# Patient Record
Sex: Female | Born: 1980 | Race: White | Hispanic: No | Marital: Married | Smoking: Former smoker
Health system: Southern US, Community
[De-identification: ages and names within clinical notes are randomized; demographics above are authoritative.]

## PROBLEM LIST (undated history)

## (undated) DIAGNOSIS — O139 Gestational [pregnancy-induced] hypertension without significant proteinuria, unspecified trimester: Secondary | ICD-10-CM

## (undated) DIAGNOSIS — E039 Hypothyroidism, unspecified: Secondary | ICD-10-CM

## (undated) HISTORY — PX: OTHER SURGICAL HISTORY: SHX169

## (undated) HISTORY — DX: Hypothyroidism, unspecified: E03.9

## (undated) HISTORY — DX: Gestational (pregnancy-induced) hypertension without significant proteinuria, unspecified trimester: O13.9

## (undated) HISTORY — PX: WISDOM TOOTH EXTRACTION: SHX21

## (undated) HISTORY — PX: GANGLION CYST EXCISION: SHX1691

## (undated) HISTORY — PX: NO PAST SURGERIES: SHX2092

---

## 2019-03-31 DIAGNOSIS — O09521 Supervision of elderly multigravida, first trimester: Secondary | ICD-10-CM | POA: Insufficient documentation

## 2019-03-31 LAB — OB RESULTS CONSOLE HEPATITIS B SURFACE ANTIGEN
Hepatitis B Surface Ag: NEGATIVE
Hepatitis B Surface Ag: POSITIVE

## 2019-03-31 LAB — OB RESULTS CONSOLE ABO/RH: RH Type: NEGATIVE

## 2019-03-31 LAB — OB RESULTS CONSOLE PLATELET COUNT: Platelets: 295

## 2019-03-31 LAB — OB RESULTS CONSOLE GC/CHLAMYDIA
Chlamydia: NEGATIVE
Gonorrhea: NEGATIVE

## 2019-03-31 LAB — OB RESULTS CONSOLE HIV ANTIBODY (ROUTINE TESTING)
HIV: NONREACTIVE
HIV: NONREACTIVE

## 2019-03-31 LAB — OB RESULTS CONSOLE RUBELLA ANTIBODY, IGM: Rubella: IMMUNE

## 2019-03-31 LAB — OB RESULTS CONSOLE HGB/HCT, BLOOD
HCT: 35 (ref 29–41)
Hemoglobin: 11.8

## 2019-03-31 LAB — OB RESULTS CONSOLE ANTIBODY SCREEN: Antibody Screen: NEGATIVE

## 2019-03-31 LAB — OB RESULTS CONSOLE RPR: RPR: NONREACTIVE

## 2019-04-28 LAB — OB RESULTS CONSOLE TSH: TSH: 1.063

## 2019-06-09 ENCOUNTER — Encounter: Payer: Self-pay | Admitting: General Practice

## 2019-06-26 ENCOUNTER — Ambulatory Visit (INDEPENDENT_AMBULATORY_CARE_PROVIDER_SITE_OTHER): Payer: Medicaid Other | Admitting: Obstetrics and Gynecology

## 2019-06-26 ENCOUNTER — Other Ambulatory Visit: Payer: Self-pay

## 2019-06-26 ENCOUNTER — Encounter: Payer: Self-pay | Admitting: Obstetrics and Gynecology

## 2019-06-26 ENCOUNTER — Encounter: Payer: Self-pay | Admitting: General Practice

## 2019-06-26 VITALS — BP 114/76 | HR 96 | Temp 97.8°F | Ht 64.0 in | Wt 192.0 lb

## 2019-06-26 DIAGNOSIS — Z3A22 22 weeks gestation of pregnancy: Secondary | ICD-10-CM

## 2019-06-26 DIAGNOSIS — O99280 Endocrine, nutritional and metabolic diseases complicating pregnancy, unspecified trimester: Secondary | ICD-10-CM

## 2019-06-26 DIAGNOSIS — O09529 Supervision of elderly multigravida, unspecified trimester: Secondary | ICD-10-CM | POA: Insufficient documentation

## 2019-06-26 DIAGNOSIS — O0990 Supervision of high risk pregnancy, unspecified, unspecified trimester: Secondary | ICD-10-CM

## 2019-06-26 DIAGNOSIS — E039 Hypothyroidism, unspecified: Secondary | ICD-10-CM

## 2019-06-26 DIAGNOSIS — M5432 Sciatica, left side: Secondary | ICD-10-CM

## 2019-06-26 DIAGNOSIS — O285 Abnormal chromosomal and genetic finding on antenatal screening of mother: Secondary | ICD-10-CM | POA: Insufficient documentation

## 2019-06-26 MED ORDER — COMFORT FIT MATERNITY SUPP SM MISC: 1.0000 [IU] | Freq: Every day | 0 refills | Status: DC | PRN

## 2019-06-26 MED ORDER — CYCLOBENZAPRINE HCL 10 MG PO TABS: 10.0000 mg | ORAL_TABLET | Freq: Every day | ORAL | 0 refills | Status: DC

## 2019-06-26 NOTE — Patient Instructions (Addendum)
Pregnancy and Hypothyroidism Hypothyroidism is a condition that develops if your thyroid has low activity. The thyroid is a small, butterfly-shaped gland in your neck. It is located in front of your windpipe. It makes hormones that play an important role in regulating your breathing, heart rate, menstrual cycle, body temperature, and other bodily functions. If you have hypothyroidism, your thyroid gland does not produce enough thyroid hormones. When you are pregnant, your body uses more thyroid hormones. This can cause mild hypothyroidism to get worse. How does this affect me? Hypothyroidism during pregnancy can cause you to have:  Fatigue.  Abnormal weight gain. For women of normal weight, it is common to gain about 1 pound per week during pregnancy.  Difficulty having a bowel movement (constipation).  Feeling cold more often than others do.  Muscle aches.  Pregnancy complications, such as: ? High blood pressure that develops after the 20th week of pregnancy (preeclampsia). ? Pregnancy loss (miscarriage). ? Preterm birth. ? Placenta problems. How does this affect my baby? Hypothyroidism can also affect your baby. Babies need thyroid hormone from their mothers for normal growth and brain development. Babies born to mothers with hypothyroidism during pregnancy may:  Be born prematurely.  Have low birth weight.  Have mental delays.  May develop hypothyroidism. This is rare. What can I do to lower my risk? Some women with hypothyroidism need extra iodine during pregnancy. Your health care provider may recommend that you:  Eat foods with iodine, such as: ? Iodized salt. ? Pasteurized eggs and dairy products. ? Low-mercury seafood.  Take a prenatal vitamin that contains iodine.  Take iodine supplements. How is this treated? Treatment may include:  Monitoring. If you have mild hypothyroidism, your health care provider will monitor your thyroid hormone levels closely to watch  for any changes.  Medicines. Your health care provider may prescribe medicine to control your thyroid hormone levels. Follow these instructions at home:  Take over-the-counter and prescription medicines only as told by your health care provider. ? Check with your health care provider before taking any hypothyroid medicines that were prescribed before you became pregnant. Many are safe, but some treatments for hypothyroidism may have to be stopped during pregnancy.  You may be asked to perform kick counts to monitor your baby's movements. If your baby moves fewer than 10 times in 2 hours during a period when the baby is usually active (typically in the evening), you should see your health care provider right away.  Take a prenatal vitamin as told by your health care provider.  Keep all follow-up visits. This is important. Contact a health care provider if you:  Have new symptoms or your symptoms get worse.  Gain more than 5 lb (2.3 kg) in 1 week.  Have a lump in your neck.  Have a scratchy throat or difficulty speaking that lasts longer than a month and is not related to a cold.  Have a hard time swallowing. Get help right away if:  Your baby is less active than normal.  Your baby stops moving completely.  You develop muscle cramps.  You have pain in your abdomen.  You have heavy bleeding.  You develop a fever or chills.  You have a very bad headache or vision problems.  You develop swelling in your legs and ankles. Summary  Hypothyroidism is a condition that develops if your thyroid has low activity.  Hypothyroidism during pregnancy can lead to complications for both you and your baby.  Take medicines, vitamins, and  supplements as told by your health care provider during your pregnancy to control your condition.  Keep regular prenatal appointments so your health care provider can closely monitor your condition during pregnancy. This information is not intended to  replace advice given to you by your health care provider. Make sure you discuss any questions you have with your health care provider. Document Revised: 08/09/2018 Document Reviewed: 05/22/2017 Elsevier Patient Education  2020 [employer] Inc[employer]   PREGNANCY SUPPORT BELT: You are not alone, Seventy-five percent of women have some sort of abdominal or back pain at some point in their pregnancy. Your baby is growing at a fast pace, which means that your whole body is rapidly trying to adjust to the changes. As your uterus grows, your back may start feeling a bit under stress and this can result in back or abdominal pain that can go from mild, and therefore bearable, to severe pains that will not allow you to sit or lay down comfortably, When it comes to dealing with pregnancy-related pains and cramps, some pregnant women usually prefer natural remedies, which the market is filled with nowadays. For example, wearing a pregnancy support belt can help ease and lessen your discomfort and pain. WHAT ARE THE BENEFITS OF WEARING A PREGNANCY SUPPORT BELT? A pregnancy support belt provides support to the lower portion of the belly taking some of the weight of the growing uterus and distributing to the other parts of your body. It is designed make you comfortable and gives you extra support. Over the years, the pregnancy apparel market has been studying the needs and wants of pregnant women and they have come up with the most comfortable pregnancy support belts that woman could ever ask for. In fact, you will no longer have to wear a stretched-out or bulky pregnancy belt that is visible underneath your clothes and makes you feel even more uncomfortable. Nowadays, a pregnancy support belt is made of comfortable and stretchy materials that will not irritate your skin but will actually make you feel at ease and you will not even notice you are wearing it. They are easy to put on and adjust during the day and can be worn at night  for additional support.  BENEFITS: . Relives Back pain . Relieves Abdominal Muscle and Leg Pain . Stabilizes the Pelvic Ring . Offers a Cushioned Abdominal Lift Pad . Relieves pressure on the Sciatic Nerve Within Minutes WHERE TO GET YOUR PREGNANCY BELT:  [employer]  [address] [place], [place] [address]  [phone]  [email or web address]  Sciatica  Sciatica is pain, numbness, weakness, or tingling along the path of the sciatic nerve. The sciatic nerve starts in the lower back and runs down the back of each leg. The nerve controls the muscles in the lower leg and in the back of the knee. It also provides feeling (sensation) to the back of the thigh, the lower leg, and the sole of the foot. Sciatica is a symptom of another medical condition that pinches or puts pressure on the sciatic nerve. Sciatica most often only affects one side of the body. Sciatica usually goes away on its own or with treatment. In some cases, sciatica may come back (recur). What are the causes? This condition is caused by pressure on the sciatic nerve or pinching of the nerve. This may be the result of:  A disk in between the bones of the spine bulging out too far (herniated disk).  Age-related changes in the spinal disks.  A pain disorder that affects a muscle in the buttock.  Extra bone growth near the sciatic nerve.  A break (fracture) of the pelvis.  Pregnancy.  Tumor. This is rare. What increases the risk? The following factors may make you more likely to develop this condition:  Playing sports that place pressure or stress on the spine.  Having poor strength and flexibility.  A history of back injury or surgery.  Sitting for long periods of time.  Doing activities that involve repetitive bending or lifting.  Obesity. What are the signs or symptoms? Symptoms can vary from mild to very severe, and they may include:  Any of these problems in the lower back, leg, hip,  or buttock: ? Mild tingling, numbness, or dull aches. ? Burning sensations. ? Sharp pains.  Numbness in the back of the calf or the sole of the foot.  Leg weakness.  Severe back pain that makes movement difficult. Symptoms may get worse when you cough, sneeze, or laugh, or when you sit or stand for long periods of time. How is this diagnosed? This condition may be diagnosed based on:  Your symptoms and medical history.  A physical exam.  Blood tests.  Imaging tests, such as: ? X-rays. ? MRI. ? CT scan. How is this treated? In many cases, this condition improves on its own without treatment. However, treatment may include:  Reducing or modifying physical activity.  Exercising and stretching.  Icing and applying heat to the affected area.  Medicines that help to: ? Relieve pain and swelling. ? Relax your muscles.  Injections of medicines that help to relieve pain, irritation, and inflammation around the sciatic nerve (steroids).  Surgery. Follow these instructions at home: Medicines  Take over-the-counter and prescription medicines only as told by your health care provider.  Ask your health care provider if the medicine prescribed to you: ? Requires you to avoid driving or using heavy machinery. ? Can cause constipation. You may need to take these actions to prevent or treat constipation:  Drink enough fluid to keep your urine pale yellow.  Take over-the-counter or prescription medicines.  Eat foods that are high in fiber, such as beans, whole grains, and fresh fruits and vegetables.  Limit foods that are high in fat and processed sugars, such as fried or sweet foods. Managing pain      If directed, put ice on the affected area. ? Put ice in a plastic bag. ? Place a towel between your skin and the bag. ? Leave the ice on for 20 minutes, 2-3 times a day.  If directed, apply heat to the affected area. Use the heat source that your health care provider  recommends, such as a moist heat pack or a heating pad. ? Place a towel between your skin and the heat source. ? Leave the heat on for 20-30 minutes. ? Remove the heat if your skin turns bright red. This is especially important if you are unable to feel pain, heat, or cold. You may have a greater risk of getting burned. Activity   Return to your normal activities as told by your health care provider. Ask your health care provider what activities are safe for you.  Avoid activities that make your symptoms worse.  Take brief periods of rest throughout the day. ? When you rest for longer periods, mix in some mild activity or stretching between periods of rest. This will help to prevent stiffness and pain. ? Avoid sitting for long periods of  time without moving. Get up and move around at least one time each hour.  Exercise and stretch regularly, as told by your health care provider.  Do not lift anything that is heavier than 10 lb (4.5 kg) while you have symptoms of sciatica. When you do not have symptoms, you should still avoid heavy lifting, especially repetitive heavy lifting.  When you lift objects, always use proper lifting technique, which includes: ? Bending your knees. ? Keeping the load close to your body. ? Avoiding twisting. General instructions  Maintain a healthy weight. Excess weight puts extra stress on your back.  Wear supportive, comfortable shoes. Avoid wearing high heels.  Avoid sleeping on a mattress that is too soft or too hard. A mattress that is firm enough to support your back when you sleep may help to reduce your pain.  Keep all follow-up visits as told by your health care provider. This is important. Contact a health care provider if:  You have pain that: ? Wakes you up when you are sleeping. ? Gets worse when you lie down. ? Is worse than you have experienced in the past. ? Lasts longer than 4 weeks.  You have an unexplained weight loss. Get help right  away if:  You are not able to control when you urinate or have bowel movements (incontinence).  You have: ? Weakness in your lower back, pelvis, buttocks, or legs that gets worse. ? Redness or swelling of your back. ? A burning sensation when you urinate. Summary  Sciatica is pain, numbness, weakness, or tingling along the path of the sciatic nerve.  This condition is caused by pressure on the sciatic nerve or pinching of the nerve.  Sciatica can cause pain, numbness, or tingling in the lower back, legs, hips, and buttocks.  Treatment often includes rest, exercise, medicines, and applying ice or heat. This information is not intended to replace advice given to you by your health care provider. Make sure you discuss any questions you have with your health care provider. Document Revised: 05/06/2018 Document Reviewed: 05/06/2018 Elsevier Patient Education  2020 [employer].

## 2019-06-26 NOTE — Progress Notes (Signed)
INITIAL OBSTETRICAL VISIT Patient name: Krista Carr MRN [id number]  Date of birth: Nov 03, 1980 Chief Complaint:   Initial Prenatal Visit  History of Present Illness:   Krista Carr is a 39 y.o. G1P0 Caucasian female at [redacted]w[redacted]d by IUI date 02/03/2019 with an Estimated Date of Delivery: 10/26/19 being seen today for her initial obstetrical visit. She is transferring her care from [facility] in [place], [place] to [facility]. Her obstetrical history is significant for female infertility, failed IVF x 2, blighted ovum with last embryo via IVF (06/2018), IUI with donor sperm [facility] 02/03/2019), hypothyroidism with "out of range thyroid labs" on Levothyroxine 75 mcg, sciatic pain on Flexeril, abnormal NIPS -- fetal echos x 2 (05/26/2019 & 06/20/2019); both normal per patient. 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 This is a planned pregnancy. She and spouse live together. She has a support system that consists of her spouse, family and friends. Today she reports sciatic nerve pain and needing refill on Flexeril .   Patient's last menstrual period was 01/19/2019. Last pap "2 years ago". Results were: normal Review of Systems:   Pertinent items are noted in HPI Denies cramping/contractions, leakage of fluid, vaginal bleeding, abnormal vaginal discharge w/ itching/odor/irritation, headaches, visual changes, shortness of breath, chest pain, abdominal pain, severe nausea/vomiting, or problems with urination or bowel movements unless otherwise stated above.  Pertinent History Reviewed:  Reviewed past medical,surgical, social, obstetrical and family history.  Reviewed problem list, medications and allergies. OB History  Gravida Para Term Preterm AB Living  1            SAB TAB Ectopic Multiple Live Births               # Outcome Date GA Lbr Len/2nd Weight Sex Delivery Anes PTL Lv   1 Current            Physical Assessment:   Vitals:   06/26/19 1042 06/26/19 1046  BP: 114/76   Pulse: 96   Temp: 97.8 F (36.6 C)   Weight: 192 lb (87.1 kg)   Height:  5\' 4"  (1.626 m)  Body mass index is 32.96 kg/m.       Physical Examination:  General appearance - well appearing, and in no distress  Mental status - alert, oriented to person, place, and time  Psych:  She has a normal mood and affect  Skin - warm and dry, normal color, no suspicious lesions noted  Chest - effort normal, all lung fields clear to auscultation bilaterally  Heart - normal rate and regular rhythm  Abdomen - soft, nontender  Extremities:  No swelling or varicosities noted  Pelvic - VULVA: normal appearing vulva with no masses, tenderness or lesions  VAGINA: normal appearing vagina with normal color and discharge, no lesions.   CERVIX: normal appearing cervix without discharge or lesions, no CMT  Thin prep pap is not done HR HPV cotesting   No results found for this or any previous visit (from the past 24 hour(s)).  Assessment & Plan:  1) High-Risk Pregnancy G1P0 at [redacted]w[redacted]d with an Estimated Date of Delivery: 10/26/19   2) Initial OB visit - Welcomed to practice and introduced self to patient in addition to discussing other advanced practice providers that she may be seeing at this practice - Congratulated patient - Anticipatory guidance on upcoming appointments - Educated on COVID19 and pregnancy and the  integration of virtual appointments  - Educated on babyscripts app- patient reports she has not received email, encouraged to look in spam folder and to call office if she still has not received email - patient verbalizes understanding   3) Hypothyroid in pregnancy, antepartum - Thyroid Panel With TSH per pt request - Advised patient she would be better managed by MDs in our practice d/t need to adjust thyroid medications. Schedule with [facility] or [facility]  4) Sciatic nerve pain, left  - Rx for  cyclobenzaprine (FLEXERIL) 10 MG tablet,  - Rx for Elastic Bandages & Supports (COMFORT FIT MATERNITY SUPP SM) MISC,   Meds:  Meds ordered this encounter  Medications  . cyclobenzaprine (FLEXERIL) 10 MG tablet    Sig: Take 1 tablet (10 mg total) by mouth at bedtime.    Dispense:  30 tablet    Refill:  0    Order Specific Question:   Supervising Provider    Answer:   Donnamae Jude [[id number]]  . DISCONTD: Elastic Bandages & Supports (COMFORT FIT MATERNITY SUPP SM) MISC    Sig: 1 Units by Does not apply route daily as needed.    Dispense:  1 each    Refill:  0    Order Specific Question:   Supervising Provider    Answer:   Donnamae Jude [[id number]]  . Elastic Bandages & Supports (COMFORT FIT MATERNITY SUPP SM) MISC    Sig: 1 Units by Does not apply route daily as needed.    Dispense:  1 each    Refill:  0    Order Specific Question:   Supervising Provider    Answer:   Merrily Pew    Initial labs & prenatal records reviewed Continue prenatal vitamins Reviewed n/v relief measures and warning s/s to report Reviewed recommended weight gain based on pre-gravid BMI Encouraged well-balanced diet Genetic Screening discussed: results reviewed Cystic fibrosis, SMA, Fragile X screening discussed results reviewed The nature of [place] with multiple MDs and other Advanced Practice Providers was explained to patient; also emphasized that residents, students are part of our team.  Discussed optimized OB schedule and video visits. Advised can have an in-office visit whenever she feels she needs to be seen.  Does not have own BP cuff. Explained to patient that BP will be mailed to her house. Check BP weekly, let us know if >140/90. Advised to call during normal business hours and there is an after-hours nurse line available.   Follow-up: Return in about 6 weeks (around 08/07/2019) for Return OB with MD -  2hr GTT.   Orders Placed This Encounter    Procedures  . OB RESULTS CONSOLE GC/Chlamydia  . OB RESULTS CONSOLE RPR  . OB RESULTS CONSOLE HIV antibody  . OB RESULTS CONSOLE Rubella Antibody  . OB RESULTS CONSOLE Hepatitis B surface antigen  . OB RESULTS CONSOLE Hemoglobin and hematocrit, blood  . OB RESULTS CONSOLE PLATELET COUNT  . Thyroid Panel With TSH  . OB RESULTS CONSOLE TSH  . OB RESULTS CONSOLE ABO/Rh  . OB RESULTS CONSOLE Antibody Screen    Laury Deep MSN, CNM 06/26/2019

## 2019-06-27 LAB — THYROID PANEL WITH TSH
Free Thyroxine Index: 1.5 (ref 1.2–4.9)
T3 Uptake Ratio: 12 % — ABNORMAL LOW (ref 24–39)
T4, Total: 12.5 ug/dL — ABNORMAL HIGH (ref 4.5–12.0)
TSH: 1.34 u[IU]/mL (ref 0.450–4.500)

## 2019-07-01 ENCOUNTER — Other Ambulatory Visit: Payer: Self-pay | Admitting: *Deleted

## 2019-07-01 MED ORDER — LEVOTHYROXINE SODIUM 75 MCG PO TABS: 75.0000 ug | ORAL_TABLET | Freq: Every day | ORAL | 3 refills | Status: DC

## 2019-07-01 NOTE — Telephone Encounter (Signed)
Patient called requesting a refill for Thyroid medication. Her previous provider will not refill the medication since she is pregnant. Patient is out of medication as of today. Took last dose this AM. Please advise.   Clovis Pu, RN

## 2019-08-11 ENCOUNTER — Other Ambulatory Visit: Payer: Medicaid Other

## 2019-08-11 ENCOUNTER — Other Ambulatory Visit: Payer: Self-pay

## 2019-08-11 ENCOUNTER — Encounter: Payer: Self-pay | Admitting: Obstetrics and Gynecology

## 2019-08-11 ENCOUNTER — Ambulatory Visit (INDEPENDENT_AMBULATORY_CARE_PROVIDER_SITE_OTHER): Payer: Medicaid Other | Admitting: Obstetrics and Gynecology

## 2019-08-11 VITALS — BP 127/82 | HR 81 | Wt 201.0 lb

## 2019-08-11 DIAGNOSIS — Z3A29 29 weeks gestation of pregnancy: Secondary | ICD-10-CM

## 2019-08-11 DIAGNOSIS — Z6791 Unspecified blood type, Rh negative: Secondary | ICD-10-CM

## 2019-08-11 DIAGNOSIS — O99280 Endocrine, nutritional and metabolic diseases complicating pregnancy, unspecified trimester: Secondary | ICD-10-CM | POA: Insufficient documentation

## 2019-08-11 DIAGNOSIS — O285 Abnormal chromosomal and genetic finding on antenatal screening of mother: Secondary | ICD-10-CM

## 2019-08-11 DIAGNOSIS — O09523 Supervision of elderly multigravida, third trimester: Secondary | ICD-10-CM

## 2019-08-11 DIAGNOSIS — O99283 Endocrine, nutritional and metabolic diseases complicating pregnancy, third trimester: Secondary | ICD-10-CM

## 2019-08-11 DIAGNOSIS — O26893 Other specified pregnancy related conditions, third trimester: Secondary | ICD-10-CM | POA: Diagnosis not present

## 2019-08-11 DIAGNOSIS — O09529 Supervision of elderly multigravida, unspecified trimester: Secondary | ICD-10-CM

## 2019-08-11 DIAGNOSIS — O26899 Other specified pregnancy related conditions, unspecified trimester: Secondary | ICD-10-CM

## 2019-08-11 DIAGNOSIS — E039 Hypothyroidism, unspecified: Secondary | ICD-10-CM

## 2019-08-11 MED ORDER — RHO D IMMUNE GLOBULIN 1500 UNIT/2ML IJ SOSY
300.0000 ug | PREFILLED_SYRINGE | Freq: Once | INTRAMUSCULAR | Status: AC
  Administered 2019-08-11: 300 ug via INTRAMUSCULAR

## 2019-08-11 MED ORDER — COMFORT FIT MATERNITY SUPP LG MISC: 0 refills | Status: DC

## 2019-08-11 NOTE — Progress Notes (Signed)
ROB   C/O: Back pain and restless legs.   Rhogam given today w/o any problems.

## 2019-08-11 NOTE — Progress Notes (Addendum)
   PRENATAL VISIT NOTE  Subjective:  Krista Carr is a 39 y.o. G1P0 at [redacted]w[redacted]d being seen today for ongoing prenatal care.  She is currently monitored for the following issues for this high-risk pregnancy and has High-risk pregnancy, multigravida of advanced maternal age, antepartum; Abnormal chromosomal and genetic finding on antenatal screening mother; and Hypothyroidism affecting pregnancy, antepartum on their problem list.  Patient reports no complaints.  Contractions: Not present. Vag. Bleeding: None.  Movement: Present. Denies leaking of fluid.   The following portions of the patient's history were reviewed and updated as appropriate: allergies, current medications, past family history, past medical history, past social history, past surgical history and problem list.   Objective:   Vitals:   08/11/19 0821  BP: 127/82  Pulse: 81  Weight: 201 lb (91.2 kg)    Fetal Status: Fetal Heart Rate (bpm): 138 Fundal Height: 30 cm Movement: Present     General:  Alert, oriented and cooperative. Patient is in no acute distress.  Skin: Skin is warm and dry. No rash noted.   Cardiovascular: Normal heart rate noted  Respiratory: Normal respiratory effort, no problems with respiration noted  Abdomen: Soft, gravid, appropriate for gestational age.  Pain/Pressure: Absent     Pelvic: Cervical exam deferred        Extremities: Normal range of motion.  Edema: Trace  Mental Status: Normal mood and affect. Normal behavior. Normal judgment and thought content.   Assessment and Plan:  Pregnancy: G1P0 at [redacted]w[redacted]d 1. High-risk pregnancy, multigravida of advanced maternal age, antepartum Patient is doing well Third trimester labs today Normal TSH last visit Patient is researching pediatricians Patient scheduled for grwoth ultrasound with [facility] this week - CBC - Glucose Tolerance, 2 Hours w/1 Hour - RPR - HIV Antibody (routine testing w rflx)  2. Hypothyroidism affecting pregnancy,  antepartum Continue on current synthroid dose  3. Abnormal chromosomal and genetic finding on antenatal screening mother Seen by genetic counseling regarding maternal mosaicism and elevated risk for Down Syndrome on first trimester screen  4. Rh negative, antepartum S/p rhogam today - rho (d) immune globulin (RHIG/RHOPHYLAC) injection 300 mcg  Preterm labor symptoms and general obstetric precautions including but not limited to vaginal bleeding, contractions, leaking of fluid and fetal movement were reviewed in detail with the patient. Please refer to After Visit Summary for other counseling recommendations.   Return in about 2 weeks (around 08/25/2019) for in person, ROB, High risk.  No future appointments.  Catalina Antigua, MD

## 2019-08-12 LAB — GLUCOSE TOLERANCE, 2 HOURS W/ 1HR
Glucose, 1 hour: 140 mg/dL (ref 65–179)
Glucose, 2 hour: 119 mg/dL (ref 65–152)
Glucose, Fasting: 75 mg/dL (ref 65–91)

## 2019-08-12 LAB — CBC
Hematocrit: 33.4 % — ABNORMAL LOW (ref 34.0–46.6)
Hemoglobin: 11.4 g/dL (ref 11.1–15.9)
MCH: 31 pg (ref 26.6–33.0)
MCHC: 34.1 g/dL (ref 31.5–35.7)
MCV: 91 fL (ref 79–97)
Platelets: 254 10*3/uL (ref 150–450)
RBC: 3.68 x10E6/uL — ABNORMAL LOW (ref 3.77–5.28)
RDW: 12.2 % (ref 11.7–15.4)
WBC: 8.7 10*3/uL (ref 3.4–10.8)

## 2019-08-12 LAB — HIV ANTIBODY (ROUTINE TESTING W REFLEX): HIV Screen 4th Generation wRfx: NONREACTIVE

## 2019-08-12 LAB — RPR: RPR Ser Ql: NONREACTIVE

## 2019-08-25 ENCOUNTER — Other Ambulatory Visit: Payer: Self-pay

## 2019-08-25 ENCOUNTER — Encounter: Payer: Self-pay | Admitting: Obstetrics and Gynecology

## 2019-08-25 ENCOUNTER — Ambulatory Visit (INDEPENDENT_AMBULATORY_CARE_PROVIDER_SITE_OTHER): Payer: Medicaid Other | Admitting: Obstetrics and Gynecology

## 2019-08-25 VITALS — BP 134/89 | HR 91 | Wt 199.0 lb

## 2019-08-25 DIAGNOSIS — O09523 Supervision of elderly multigravida, third trimester: Secondary | ICD-10-CM

## 2019-08-25 DIAGNOSIS — E039 Hypothyroidism, unspecified: Secondary | ICD-10-CM

## 2019-08-25 DIAGNOSIS — O99283 Endocrine, nutritional and metabolic diseases complicating pregnancy, third trimester: Secondary | ICD-10-CM

## 2019-08-25 DIAGNOSIS — O99280 Endocrine, nutritional and metabolic diseases complicating pregnancy, unspecified trimester: Secondary | ICD-10-CM

## 2019-08-25 DIAGNOSIS — O285 Abnormal chromosomal and genetic finding on antenatal screening of mother: Secondary | ICD-10-CM

## 2019-08-25 DIAGNOSIS — Z3A31 31 weeks gestation of pregnancy: Secondary | ICD-10-CM

## 2019-08-25 DIAGNOSIS — Z6791 Unspecified blood type, Rh negative: Secondary | ICD-10-CM | POA: Insufficient documentation

## 2019-08-25 DIAGNOSIS — O09529 Supervision of elderly multigravida, unspecified trimester: Secondary | ICD-10-CM

## 2019-08-25 DIAGNOSIS — O26893 Other specified pregnancy related conditions, third trimester: Secondary | ICD-10-CM

## 2019-08-25 MED ORDER — BUTALBITAL-APAP-CAFFEINE 50-325-40 MG PO CAPS: 1.0000 | ORAL_CAPSULE | Freq: Four times a day (QID) | ORAL | 3 refills | Status: DC | PRN

## 2019-08-25 NOTE — Progress Notes (Signed)
   PRENATAL VISIT NOTE  Subjective:  Krista Carr is a 39 y.o. G1P0 at [redacted]w[redacted]d being seen today for ongoing prenatal care.  She is currently monitored for the following issues for this high-risk pregnancy and has High-risk pregnancy, multigravida of advanced maternal age, antepartum; Abnormal chromosomal and genetic finding on antenatal screening mother; Hypothyroidism affecting pregnancy, antepartum; and Rh negative state in antepartum period on their problem list.  Patient reports headache.  Contractions: Not present. Vag. Bleeding: None.  Movement: Present. Denies leaking of fluid.   The following portions of the patient's history were reviewed and updated as appropriate: allergies, current medications, past family history, past medical history, past social history, past surgical history and problem list.   Objective:   Vitals:   08/25/19 0954  BP: 134/89  Pulse: 91  Weight: 199 lb (90.3 kg)    Fetal Status: Fetal Heart Rate (bpm): 159 Fundal Height: 32 cm Movement: Present     General:  Alert, oriented and cooperative. Patient is in no acute distress.  Skin: Skin is warm and dry. No rash noted.   Cardiovascular: Normal heart rate noted  Respiratory: Normal respiratory effort, no problems with respiration noted  Abdomen: Soft, gravid, appropriate for gestational age.  Pain/Pressure: Absent     Pelvic: Cervical exam deferred        Extremities: Normal range of motion.  Edema: Trace  Mental Status: Normal mood and affect. Normal behavior. Normal judgment and thought content.   Assessment and Plan:  Pregnancy: G1P0 at [redacted]w[redacted]d 1. High-risk pregnancy, multigravida of advanced maternal age, antepartum Patient is doing well. Rx Fioricet provided for migraine headaches Normal growth ultrasound 08/18/19 EFW 1715 gm (69% tile)  2. Hypothyroidism affecting pregnancy, antepartum Continue Synthroid  3. Abnormal chromosomal and genetic finding on antenatal screening mother S/p genetic  counseling  Preterm labor symptoms and general obstetric precautions including but not limited to vaginal bleeding, contractions, leaking of fluid and fetal movement were reviewed in detail with the patient. Please refer to After Visit Summary for other counseling recommendations.   Return in about 2 weeks (around 09/08/2019) for ROB, High risk.  No future appointments.  Catalina Antigua, MD

## 2019-08-25 NOTE — Progress Notes (Signed)
ROB.  C/o headaches 8/10 x 3 days, she has tried everything and nothing works.

## 2019-09-08 ENCOUNTER — Other Ambulatory Visit: Payer: Self-pay

## 2019-09-08 ENCOUNTER — Encounter: Payer: Self-pay | Admitting: Obstetrics and Gynecology

## 2019-09-08 ENCOUNTER — Ambulatory Visit (INDEPENDENT_AMBULATORY_CARE_PROVIDER_SITE_OTHER): Payer: Medicaid Other | Admitting: Obstetrics and Gynecology

## 2019-09-08 VITALS — BP 116/78 | HR 89 | Wt 203.0 lb

## 2019-09-08 DIAGNOSIS — O99280 Endocrine, nutritional and metabolic diseases complicating pregnancy, unspecified trimester: Secondary | ICD-10-CM

## 2019-09-08 DIAGNOSIS — O26899 Other specified pregnancy related conditions, unspecified trimester: Secondary | ICD-10-CM

## 2019-09-08 DIAGNOSIS — O09529 Supervision of elderly multigravida, unspecified trimester: Secondary | ICD-10-CM

## 2019-09-08 DIAGNOSIS — O285 Abnormal chromosomal and genetic finding on antenatal screening of mother: Secondary | ICD-10-CM

## 2019-09-08 DIAGNOSIS — Z6791 Unspecified blood type, Rh negative: Secondary | ICD-10-CM

## 2019-09-08 DIAGNOSIS — E039 Hypothyroidism, unspecified: Secondary | ICD-10-CM

## 2019-09-08 NOTE — Progress Notes (Signed)
Pt would like to know about how often she should have TSH labs.

## 2019-09-08 NOTE — Progress Notes (Signed)
Subjective:  Krista Carr is a 39 y.o. G1P0 at [redacted]w[redacted]d being seen today for ongoing prenatal care.  She is currently monitored for the following issues for this high-risk pregnancy and has High-risk pregnancy, multigravida of advanced maternal age, antepartum; Abnormal chromosomal and genetic finding on antenatal screening mother; Hypothyroidism affecting pregnancy, antepartum; and Rh negative state in antepartum period on their problem list.  Patient reports general discmforts of pregnancy.  Contractions: Not present. Vag. Bleeding: None.  Movement: Present. Denies leaking of fluid.   The following portions of the patient's history were reviewed and updated as appropriate: allergies, current medications, past family history, past medical history, past social history, past surgical history and problem list. Problem list updated.  Objective:   Vitals:   09/08/19 0927  BP: 116/78  Pulse: 89  Weight: 203 lb (92.1 kg)    Fetal Status: Fetal Heart Rate (bpm): 130   Movement: Present     General:  Alert, oriented and cooperative. Patient is in no acute distress.  Skin: Skin is warm and dry. No rash noted.   Cardiovascular: Normal heart rate noted  Respiratory: Normal respiratory effort, no problems with respiration noted  Abdomen: Soft, gravid, appropriate for gestational age. Pain/Pressure: Absent     Pelvic:  Cervical exam deferred        Extremities: Normal range of motion.  Edema: Trace  Mental Status: Normal mood and affect. Normal behavior. Normal judgment and thought content.   Urinalysis:      Assessment and Plan:  Pregnancy: G1P0 at [redacted]w[redacted]d  1. High-risk pregnancy, multigravida of advanced maternal age, antepartum Stable - [place] MFM OB DETAIL +14 WK; Future  2. Hypothyroidism affecting pregnancy, antepartum Continue with Syntyhroid - Thyroid Panel With TSH - [place] MFM OB DETAIL +14 WK; Future  3. Abnormal chromosomal and genetic finding on antenatal screening mother S/P genetic  counseling - [place] MFM OB DETAIL +14 WK; Future  4. Rh negative state in antepartum period S/P Rhogam  Preterm labor symptoms and general obstetric precautions including but not limited to vaginal bleeding, contractions, leaking of fluid and fetal movement were reviewed in detail with the patient. Please refer to After Visit Summary for other counseling recommendations.  Return in about 2 weeks (around 09/22/2019) for OB visit, face to face, female MD if able.   Hermina Staggers, MD

## 2019-09-09 LAB — THYROID PANEL WITH TSH
Free Thyroxine Index: 1.9 (ref 1.2–4.9)
T3 Uptake Ratio: 16 % — ABNORMAL LOW (ref 24–39)
T4, Total: 12 ug/dL (ref 4.5–12.0)
TSH: 1.2 u[IU]/mL (ref 0.450–4.500)

## 2019-09-22 ENCOUNTER — Other Ambulatory Visit: Payer: Self-pay

## 2019-09-22 ENCOUNTER — Ambulatory Visit: Payer: Medicaid Other | Admitting: *Deleted

## 2019-09-22 ENCOUNTER — Ambulatory Visit (INDEPENDENT_AMBULATORY_CARE_PROVIDER_SITE_OTHER): Payer: Medicaid Other | Admitting: Obstetrics & Gynecology

## 2019-09-22 ENCOUNTER — Encounter: Payer: Self-pay | Admitting: Obstetrics & Gynecology

## 2019-09-22 ENCOUNTER — Ambulatory Visit: Payer: Medicaid Other | Attending: Obstetrics and Gynecology

## 2019-09-22 VITALS — BP 135/85 | HR 101 | Wt 202.6 lb

## 2019-09-22 DIAGNOSIS — Z3A35 35 weeks gestation of pregnancy: Secondary | ICD-10-CM

## 2019-09-22 DIAGNOSIS — Z6791 Unspecified blood type, Rh negative: Secondary | ICD-10-CM

## 2019-09-22 DIAGNOSIS — O09523 Supervision of elderly multigravida, third trimester: Secondary | ICD-10-CM

## 2019-09-22 DIAGNOSIS — O09813 Supervision of pregnancy resulting from assisted reproductive technology, third trimester: Secondary | ICD-10-CM | POA: Diagnosis not present

## 2019-09-22 DIAGNOSIS — O285 Abnormal chromosomal and genetic finding on antenatal screening of mother: Secondary | ICD-10-CM

## 2019-09-22 DIAGNOSIS — O09529 Supervision of elderly multigravida, unspecified trimester: Secondary | ICD-10-CM | POA: Insufficient documentation

## 2019-09-22 DIAGNOSIS — O26899 Other specified pregnancy related conditions, unspecified trimester: Secondary | ICD-10-CM | POA: Insufficient documentation

## 2019-09-22 DIAGNOSIS — E039 Hypothyroidism, unspecified: Secondary | ICD-10-CM | POA: Diagnosis present

## 2019-09-22 DIAGNOSIS — Z363 Encounter for antenatal screening for malformations: Secondary | ICD-10-CM

## 2019-09-22 DIAGNOSIS — O09513 Supervision of elderly primigravida, third trimester: Secondary | ICD-10-CM

## 2019-09-22 DIAGNOSIS — O99280 Endocrine, nutritional and metabolic diseases complicating pregnancy, unspecified trimester: Secondary | ICD-10-CM | POA: Insufficient documentation

## 2019-09-22 DIAGNOSIS — O281 Abnormal biochemical finding on antenatal screening of mother: Secondary | ICD-10-CM | POA: Diagnosis not present

## 2019-09-22 DIAGNOSIS — O99283 Endocrine, nutritional and metabolic diseases complicating pregnancy, third trimester: Secondary | ICD-10-CM

## 2019-09-22 DIAGNOSIS — O2693 Pregnancy related conditions, unspecified, third trimester: Secondary | ICD-10-CM

## 2019-09-22 IMAGING — US US MFM OB DETAIL+14 WK
1 series · 12 of 28 positions shown · non-contrast
Comparison: none

[Series 1: us mfm ob detail+14 wk · 12 of 112 slices shown]
[im 5/112]
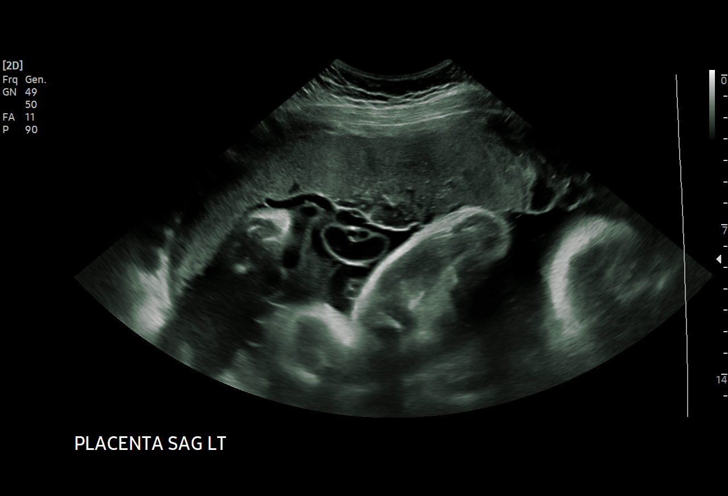
[im 13/112]
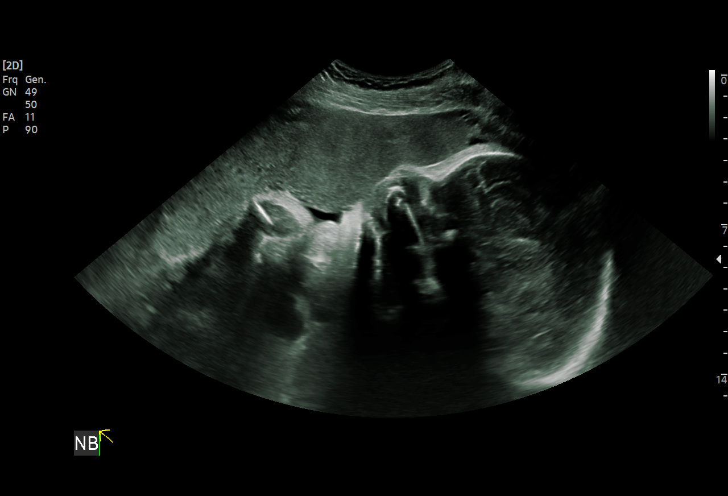
[im 21/112]
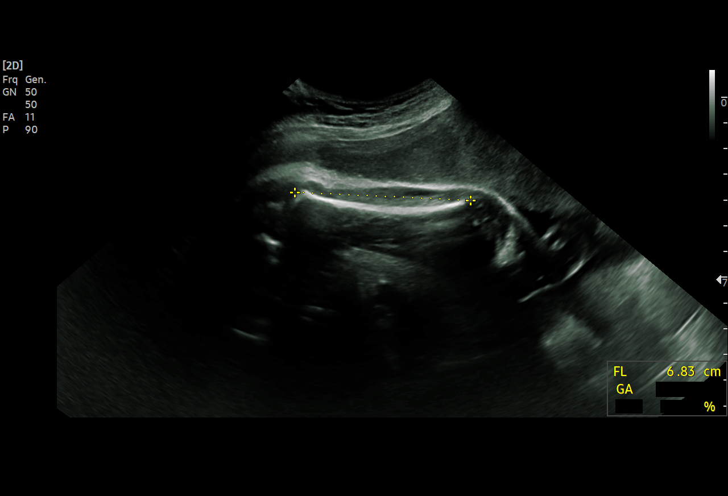
[im 33/112]
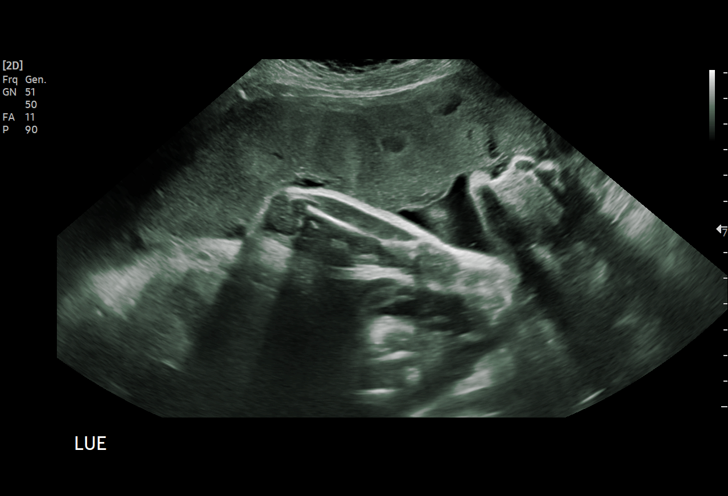
[im 42/112]
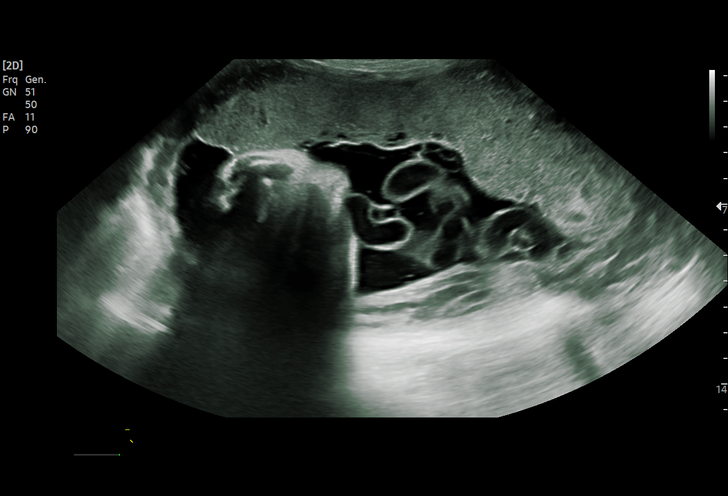
[im 50/112]
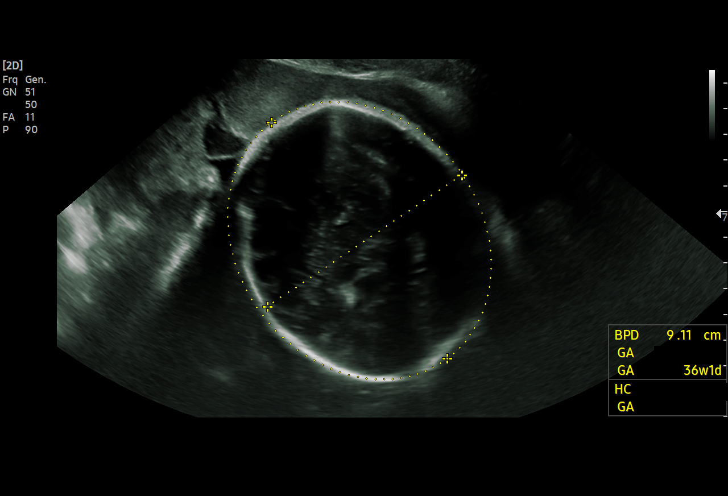
[im 62/112]
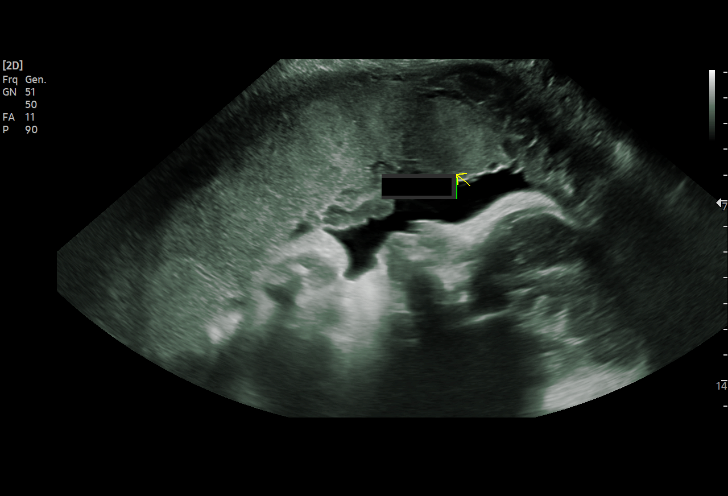
[im 70/112]
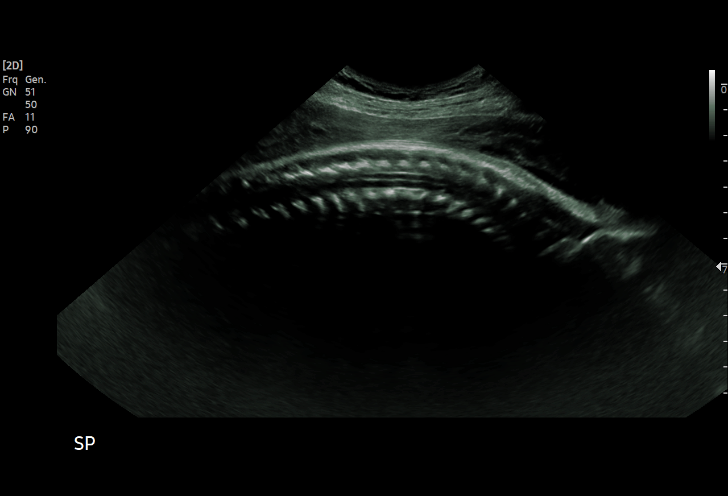
[im 79/112]
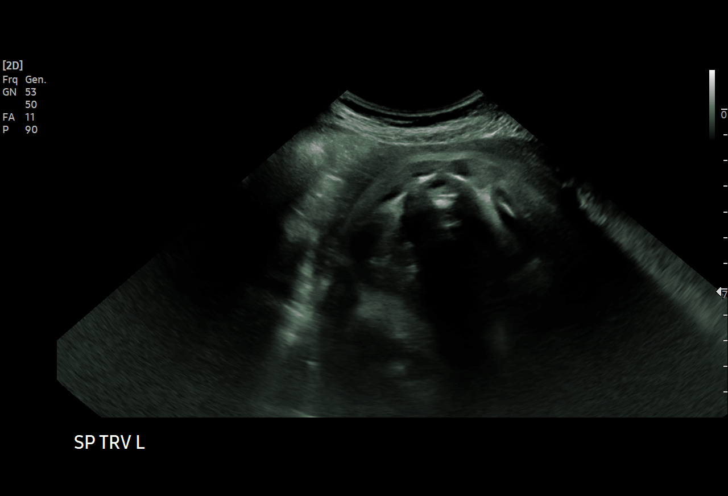
[im 91/112]
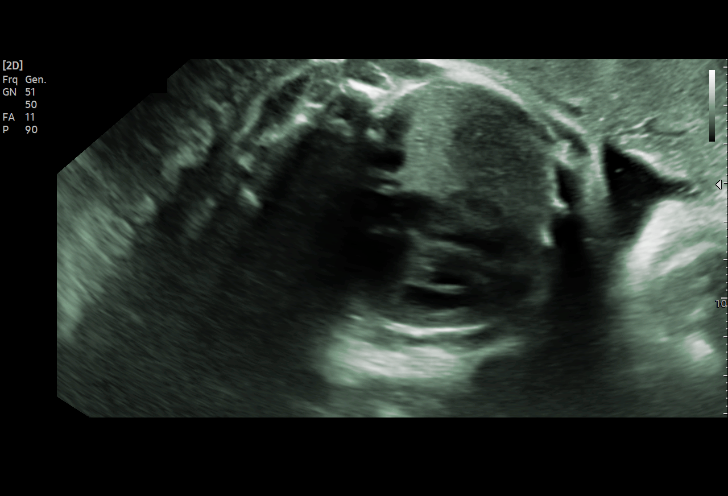
[im 99/112]
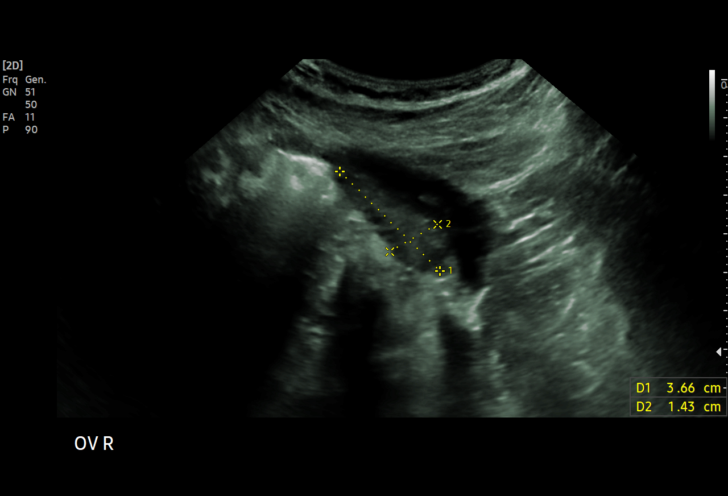
[im 107/112]
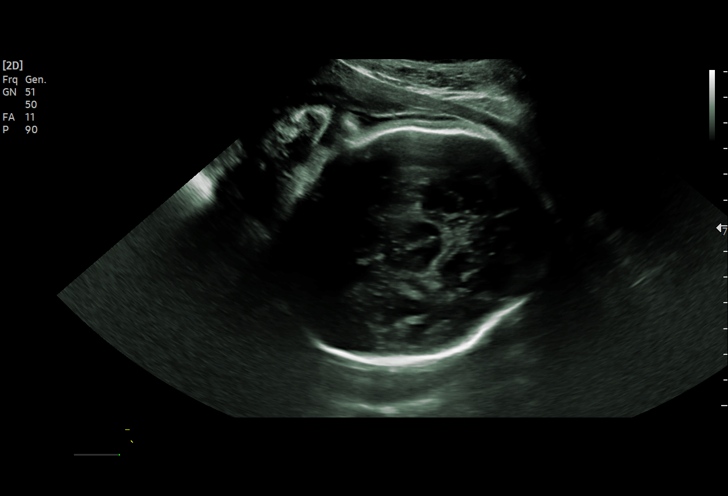

[12 of 28 positions shown; findings below may reference images not displayed]

Indications

 Advanced maternal age primigravida 35+,        [SU]
 third trimester
 Abnormal biochemical finding on antenatal      [SU]
 screening of mother ([facility]: elevated T21 risk
 1 in 6, NIPS: maternal origin [id number] 18[facility])
 35 weeks gestation of pregnancy
 Encounter for antenatal screening for          [SU]
 malformations
 Pregnancy resulting from assisted              [SU]
 reproductive technology IUI.
 Hypothyroid                                    [SU] [SU]
 Medical complication of pregnancy (specify)    [SU]
Fetal Evaluation

 Num Of Fetuses:         1
 Fetal Heart Rate(bpm):  166
 Cardiac Activity:       Observed
 Presentation:           Cephalic
 Placenta:               Anterior

 Amniotic Fluid
 AFI FV:      Within normal limits

 AFI Sum(cm)     %Tile       Largest Pocket(cm)
 11.9            35          5

 RUQ(cm)       RLQ(cm)       LUQ(cm)        LLQ(cm)
 1.8           3.3           1.8            5
Biometry
 BPD:      90.2  mm     G. Age:  36w 4d         87  %    CI:        72.15   %    70 - 86
                                                         FL/HC:      20.0   %    20.1 -
 HC:      337.9  mm     G. Age:  38w 5d         93  %    HC/AC:      1.06        0.93 -
 AC:       318   mm     G. Age:  35w 5d         74  %    FL/BPD:     74.9   %    71 - 87
 FL:       67.6  mm     G. Age:  34w 5d         32  %    FL/AC:      21.3   %    20 - 24
 HUM:        59  mm     G. Age:  34w 1d         47  %
 CER:      51.4  mm     G. Age:  N/A          > 95  %

 LV:        5.7  mm

 Est. FW:    [SU]  gm      6 lb 2 oz     69  %
OB History

 Gravidity:    1
Gestational Age

 Clinical EDD:  35w 1d                                        EDD:   [DATE]
 U/S Today:     36w 3d                                        EDD:   [DATE]
 Best:          35w 1d     Det. By:  Clinical EDD             EDD:   [DATE]
Anatomy

 Cranium:               Appears normal         LVOT:                   Appears normal
 Cavum:                 Appears normal         Aortic Arch:            Appears normal
 Ventricles:            Appears normal         Ductal Arch:            Appears normal
 Choroid Plexus:        Appears normal         Diaphragm:              Appears normal
 Cerebellum:            Appears normal         Stomach:                Appears normal, left
                                                                       sided
 Posterior Fossa:       Appears normal         Abdomen:                Appears normal
 Nuchal Fold:           Not applicable (>20    Abdominal Wall:         Appears nml (cord
                        wks GA)                                        insert, abd wall)
 Face:                  Appears normal         Cord Vessels:           Appears normal (3
                        (orbits and profile)                           vessel cord)
 Lips:                  Appears normal         Kidneys:                Appear normal
 Palate:                Appears normal         Bladder:                Appears normal
 Thoracic:              Appears normal         Spine:                  Appears normal
 Heart:                 Appears normal         Upper Extremities:      Appears normal
                        (4CH, axis, and
                        situs)
 RVOT:                  Appears normal         Lower Extremities:      Appears normal

 Other:  Nasal bone visualized. Male gender.
Cervix Uterus Adnexa

 Cervix
 Not visualized (advanced GA >[SU])

 Right Ovary
 Within normal limits.

 Left Ovary
 Within normal limits.
Comments

 This patient was seen for a growth ultrasound as she recently
 transferred her care from [facility] for delivery at [HOSP[facility]].  The patient's current pregnancy has been
 complicated by a first trimester nuchal translucency
 screening test which indicated an increased risk of Down
 syndrome of 1 in 6.  She subsequently had a cell free DNA
 test (Materni T21) that had nonreportable results.  Based on
 a discussion with the genetic counselor from [facility], the
 NIPS sample identified a 9P duplication and 18 P deletion
 which was suspected to be of maternal origin.  The patient
 subsequently had a karyotype sample performed on her
 blood which indicated an abnormal mosaic female karyotype
 with an apparently unbalanced reciprocal translocation
 between chromosomes 9 and 18.  The patient has been seen
 and has received counseling from the genetic counselor at
 [facility].
 The patient was also seen by MFM at [facility] earlier in
 her pregnancy and has declined an amniocentesis for
 definitive diagnosis of a chromosomal abnormality in her
 fetus.  The patient reports that a chromosomally abnormal
 fetus would not change the outcome of her current
 pregnancy.  She also reports that she had a normal fetal
 echocardiogram performed by pediatric cardiology earlier in
 her pregnancy.
 On today's exam, the fetal growth and amniotic fluid level
 appears appropriate for her gestational age.
 The patient was advised to have her baby tested after birth to
 determine if a chromosomal abnormality is present.
 The patient's blood pressures in our office today were noted
 to be 123/91 and 134/93.  Preeclampsia precautions were
 reviewed today.  She was advised to go to the hospital should
 she have a persistent headache, should she see spots in
 front of her eyes or should she experience any significant
 abdominal pain.
 No further exams were scheduled in our office.

## 2019-09-22 NOTE — Patient Instructions (Signed)
Return to office for any scheduled appointments. Call the office or go to the MAU at [facility] if:  You begin to have strong, frequent contractions  Your water breaks.  Sometimes it is a big gush of fluid, sometimes it is just a trickle that keeps getting your panties wet or running down your legs  You have vaginal bleeding.  It is normal to have a small amount of spotting if your cervix was checked.   You do not feel your baby moving like normal.  If you do not, get something to eat and drink and lay down and focus on feeling your baby move.   If your baby is still not moving like normal, you should call the office or go to MAU.  Any other obstetric concerns.   

## 2019-09-22 NOTE — Progress Notes (Signed)
   PRENATAL VISIT NOTE  Subjective:  Krista Carr is a 39 y.o. G1P0 at [redacted]w[redacted]d being seen today for ongoing prenatal care.  She is currently monitored for the following issues for this high-risk pregnancy and has High-risk pregnancy, multigravida of advanced maternal age, antepartum; Abnormal chromosomal and genetic finding on antenatal screening mother; Hypothyroidism affecting pregnancy, antepartum; and Rh negative state in antepartum period on their problem list.  Patient reports no complaints.  Contractions: Not present.  .  Movement: Present. Denies leaking of fluid.   The following portions of the patient's history were reviewed and updated as appropriate: allergies, current medications, past family history, past medical history, past social history, past surgical history and problem list.   Objective:   Vitals:   09/22/19 0943  BP: 135/85  Pulse: (!) 101  Weight: 202 lb 9.6 oz (91.9 kg)    Fetal Status: Fetal Heart Rate (bpm): 136 Fundal Height: 35 cm Movement: Present     General:  Alert, oriented and cooperative. Patient is in no acute distress.  Skin: Skin is warm and dry. No rash noted.   Cardiovascular: Normal heart rate noted  Respiratory: Normal respiratory effort, no problems with respiration noted  Abdomen: Soft, gravid, appropriate for gestational age.  Pain/Pressure: Absent     Pelvic: Cervical exam deferred        Extremities: Normal range of motion.  Edema: Trace  Mental Status: Normal mood and affect. Normal behavior. Normal judgment and thought content.   Assessment and Plan:  Pregnancy: G1P0 at [redacted]w[redacted]d 1. Hypothyroidism affecting pregnancy, antepartum Stable thyroid panel on 09/08/19. Continue Levothyroxine 75 daily.  2. [redacted] weeks gestation of pregnancy 3. High-risk pregnancy, multigravida of advanced maternal age, antepartum No other concerns. Pelvic cultures next week. Ultrasound scheduled today, will follow up results and manage accordingly.  Preterm labor  symptoms and general obstetric precautions including but not limited to vaginal bleeding, contractions, leaking of fluid and fetal movement were reviewed in detail with the patient. Please refer to After Visit Summary for other counseling recommendations.   Return in about 1 week (around 09/29/2019) for OFFICE OB Visit, Pelvic cultures.  Future Appointments  Date Time Provider Department Center  09/22/2019 11:00 AM [facility] NURSE [facility]  09/22/2019 11:00 AM [facility]  10/07/2019 11:00 AM Conan Bowens, MD [facility] None    Jaynie Collins, MD

## 2019-09-23 ENCOUNTER — Other Ambulatory Visit: Payer: Self-pay

## 2019-09-23 ENCOUNTER — Encounter (HOSPITAL_COMMUNITY): Payer: Self-pay | Admitting: Obstetrics and Gynecology

## 2019-09-23 ENCOUNTER — Inpatient Hospital Stay (HOSPITAL_COMMUNITY)
Admission: AD | Admit: 2019-09-23 | Discharge: 2019-09-23 | Disposition: A | Discharge status: Home / Self Care | Payer: Medicaid Other | Attending: Obstetrics and Gynecology | Admitting: Obstetrics and Gynecology

## 2019-09-23 DIAGNOSIS — E039 Hypothyroidism, unspecified: Secondary | ICD-10-CM | POA: Diagnosis not present

## 2019-09-23 DIAGNOSIS — Z3A35 35 weeks gestation of pregnancy: Secondary | ICD-10-CM | POA: Diagnosis not present

## 2019-09-23 DIAGNOSIS — Z3689 Encounter for other specified antenatal screening: Secondary | ICD-10-CM | POA: Insufficient documentation

## 2019-09-23 DIAGNOSIS — O99283 Endocrine, nutritional and metabolic diseases complicating pregnancy, third trimester: Secondary | ICD-10-CM | POA: Diagnosis not present

## 2019-09-23 DIAGNOSIS — O133 Gestational [pregnancy-induced] hypertension without significant proteinuria, third trimester: Secondary | ICD-10-CM | POA: Diagnosis present

## 2019-09-23 DIAGNOSIS — Z87891 Personal history of nicotine dependence: Secondary | ICD-10-CM | POA: Insufficient documentation

## 2019-09-23 LAB — COMPREHENSIVE METABOLIC PANEL
ALT: 12 U/L (ref 0–44)
AST: 17 U/L (ref 15–41)
Albumin: 2.6 g/dL — ABNORMAL LOW (ref 3.5–5.0)
Alkaline Phosphatase: 107 U/L (ref 38–126)
Anion gap: 11 (ref 5–15)
BUN: 5 mg/dL — ABNORMAL LOW (ref 6–20)
CO2: 23 mmol/L (ref 22–32)
Calcium: 8.6 mg/dL — ABNORMAL LOW (ref 8.9–10.3)
Chloride: 104 mmol/L (ref 98–111)
Creatinine, Ser: 0.54 mg/dL (ref 0.44–1.00)
GFR calc Af Amer: >60 mL/min (ref >60)
GFR calc non Af Amer: >60 mL/min (ref >60)
Glucose, Bld: 119 mg/dL — ABNORMAL HIGH (ref 70–99)
Potassium: 4.5 mmol/L (ref 3.5–5.1)
Sodium: 138 mmol/L (ref 135–145)
Total Bilirubin: 0.4 mg/dL (ref 0.3–1.2)
Total Protein: 5.8 g/dL — ABNORMAL LOW (ref 6.5–8.1)

## 2019-09-23 LAB — CBC
HCT: 33.4 % — ABNORMAL LOW (ref 36.0–46.0)
Hemoglobin: 11 g/dL — ABNORMAL LOW (ref 12.0–15.0)
MCH: 29.1 pg (ref 26.0–34.0)
MCHC: 32.9 g/dL (ref 30.0–36.0)
MCV: 88.4 fL (ref 80.0–100.0)
Platelets: 299 10*3/uL (ref 150–400)
RBC: 3.78 MIL/uL — ABNORMAL LOW (ref 3.87–5.11)
RDW: 12.5 % (ref 11.5–15.5)
WBC: 9.8 10*3/uL (ref 4.0–10.5)
nRBC: 0 % (ref 0.0–0.2)

## 2019-09-23 LAB — PROTEIN / CREATININE RATIO, URINE
Creatinine, Urine: 33.13 mg/dL
Protein Creatinine Ratio: 0.27 mg/mg{Cre} — ABNORMAL HIGH (ref 0.00–0.15)
Total Protein, Urine: 9 mg/dL

## 2019-09-23 MED ORDER — CALCIUM CARBONATE ANTACID 500 MG PO CHEW
2.0000 | CHEWABLE_TABLET | Freq: Once | ORAL | Status: AC
  Administered 2019-09-23: 400 mg via ORAL
  Filled 2019-09-23: qty 2

## 2019-09-23 NOTE — Discharge Instructions (Signed)

## 2019-09-23 NOTE — MAU Provider Note (Signed)
History     CSN: [id number]  Arrival date and time: 09/23/19 0946   First Provider Initiated Contact with Patient 09/23/19 1037      Chief Complaint  Patient presents with  . Hypertension   39 y.o. G1 @35 .2 wks presenting with elevated BP. Was first elevated at home yesterday and again today. Denies HA, visual disturbances, RUQ pain, SOB, and CP. Reports good FM. No VB, LOF, ctx. Her pregnancy is complicated by AMA, Hypothyroidism, elevated T21 risk on NIPS, Rh negative, and IUI pregnancy with donor sperm.    OB History    Gravida  1   Para      Term      Preterm      AB      Living        SAB      TAB      Ectopic      Multiple      Live Births              Past Medical History:  Diagnosis Date  . Hypothyroidism     Past Surgical History:  Procedure Laterality Date  . Egg Retrieval    . GANGLION CYST EXCISION    . WISDOM TOOTH EXTRACTION      Family History  Problem Relation Age of Onset  . Multiple sclerosis Mother     Social History   Tobacco Use  . Smoking status: Former Research scientist (life sciences)  . Smokeless tobacco: Never Used  Substance Use Topics  . Alcohol use: Never  . Drug use: Never    Allergies: No Known Allergies  No medications prior to admission.    Review of Systems  Eyes: Negative for visual disturbance.  Cardiovascular: Negative for leg swelling.  Gastrointestinal: Negative for abdominal pain.  Genitourinary: Negative for vaginal bleeding and vaginal discharge.  Neurological: Negative for headaches.   Physical Exam   Blood pressure 136/86, pulse 98, temperature 98.3 F (36.8 C), temperature source Oral, resp. rate 18, height 5\' 4"  (1.626 m), weight 92.1 kg, last menstrual period 01/19/2019, SpO2 98 %. Patient Vitals for the past 24 hrs:  BP Temp Temp src Pulse Resp SpO2 Height Weight  09/23/19 1130 136/86 -- -- 98 -- 98 % -- --  09/23/19 1125 -- -- -- -- -- 97 % -- --  09/23/19 1115 (!) 131/96 -- -- 98 -- 98 % -- --   09/23/19 1110 -- -- -- -- -- 99 % -- --  09/23/19 1105 -- -- -- -- -- 98 % -- --  09/23/19 1100 140/88 -- -- 100 -- 98 % -- --  09/23/19 1055 -- -- -- -- -- 98 % -- --  09/23/19 1050 -- -- -- -- -- 98 % -- --  09/23/19 1045 (!) 137/101 -- -- (!) 101 -- 98 % -- --  09/23/19 1040 -- -- -- -- -- 98 % -- --  09/23/19 1035 -- -- -- -- -- 98 % -- --  09/23/19 1030 (!) 129/95 -- -- (!) 104 -- 98 % -- --  09/23/19 1027 (!) 133/93 -- -- (!) 105 -- -- -- --  09/23/19 1025 -- -- -- -- -- 99 % -- --  09/23/19 1011 137/88 98.3 F (36.8 C) Oral 98 18 98 % 5\' 4"  (1.626 m) 92.1 kg    Physical Exam  Nursing note and vitals reviewed. Constitutional: She is oriented to person, place, and time. She appears well-developed and well-nourished. No distress.  HENT:  Head:  Normocephalic and atraumatic.  Cardiovascular: Normal rate.  Respiratory: Effort normal. No respiratory distress.  Musculoskeletal:        General: Normal range of motion.     Cervical back: Normal range of motion.  Neurological: She is alert and oriented to person, place, and time.  Psychiatric: She has a normal mood and affect.  EFM: 145 bpm, mod variability, + accels, no decels Toco: rare  Results for orders placed or performed during the hospital encounter of 09/23/19 (from the past 24 hour(s))  CBC     Status: Abnormal   Collection Time: 09/23/19 10:17 AM  Result Value Ref Range   WBC 9.8 4.0 - 10.5 K/uL   RBC 3.78 (L) 3.87 - 5.11 MIL/uL   Hemoglobin 11.0 (L) 12.0 - 15.0 g/dL   HCT 48.2 (L) 70.7 - 86.7 %   MCV 88.4 80.0 - 100.0 fL   MCH 29.1 26.0 - 34.0 pg   MCHC 32.9 30.0 - 36.0 g/dL   RDW 54.4 92.0 - 10.0 %   Platelets 299 150 - 400 K/uL   nRBC 0.0 0.0 - 0.2 %  Comprehensive metabolic panel     Status: Abnormal   Collection Time: 09/23/19 10:17 AM  Result Value Ref Range   Sodium 138 135 - 145 mmol/L   Potassium 4.5 3.5 - 5.1 mmol/L   Chloride 104 98 - 111 mmol/L   CO2 23 22 - 32 mmol/L   Glucose, Bld 119 (H) 70 -  99 mg/dL   BUN 5 (L) 6 - 20 mg/dL   Creatinine, Ser 7.12 0.44 - 1.00 mg/dL   Calcium 8.6 (L) 8.9 - 10.3 mg/dL   Total Protein 5.8 (L) 6.5 - 8.1 g/dL   Albumin 2.6 (L) 3.5 - 5.0 g/dL   AST 17 15 - 41 U/L   ALT 12 0 - 44 U/L   Alkaline Phosphatase 107 38 - 126 U/L   Total Bilirubin 0.4 0.3 - 1.2 mg/dL   GFR calc non Af Amer >60 >60 mL/min   GFR calc Af Amer >60 >60 mL/min   Anion gap 11 5 - 15  Protein / creatinine ratio, urine     Status: Abnormal   Collection Time: 09/23/19 11:00 AM  Result Value Ref Range   Creatinine, Urine 33.13 mg/dL   Total Protein, Urine 9 mg/dL   Protein Creatinine Ratio 0.27 (H) 0.00 - 0.15 mg/mg[Cre]    MAU Course  Procedures  MDM Labs ordered and reviewed. Meets criteria for gHTN. Discussed recommendation for delivery at 37 weeks. Will arrange f/u appt end of this week or early next for BP check, schedule IOL, and obtain GBS culture. Stable for discharge home.  Assessment and Plan   1. [redacted] weeks gestation of pregnancy   2. Gestational hypertension, third trimester   3. NST (non-stress test) reactive    Discharge home Follow up at [facility] as planned- clinic to arrange appt, message sent Strict PEC precautions  Allergies as of 09/23/2019   No Known Allergies     Medication List    TAKE these medications   Comfort Fit Maternity Supp Lg Misc Wear daily when ambulating What changed: Another medication with the same name was removed. Continue taking this medication, and follow the directions you see here.   ferrous sulfate 325 (65 FE) MG tablet Take 325 mg by mouth daily with breakfast.   levothyroxine 75 MCG tablet Commonly known as: SYNTHROID Take 1 tablet (75 mcg total) by mouth daily before breakfast.  multivitamin-prenatal 27-0.8 MG Tabs tablet Take 1 tablet by mouth daily at 12 noon.       Donette Larry, CNM 09/23/2019, 12:20 PM

## 2019-09-23 NOTE — MAU Note (Addendum)
BP still up for her. Sent in for further eval. Had a HA yesterday, denies visual changes, epigastric pain or increase in swelling.  Sometimes has problems with breathing.

## 2019-09-26 ENCOUNTER — Encounter: Payer: Self-pay | Admitting: Medical

## 2019-09-26 ENCOUNTER — Encounter (HOSPITAL_COMMUNITY): Payer: Self-pay | Admitting: *Deleted

## 2019-09-26 ENCOUNTER — Other Ambulatory Visit: Payer: Self-pay

## 2019-09-26 ENCOUNTER — Telehealth (HOSPITAL_COMMUNITY): Payer: Self-pay | Admitting: *Deleted

## 2019-09-26 ENCOUNTER — Other Ambulatory Visit (HOSPITAL_COMMUNITY)
Admission: RE | Admit: 2019-09-26 | Discharge: 2019-09-26 | Disposition: A | Discharge status: Home / Self Care | Payer: Medicaid Other | Source: Ambulatory Visit | Attending: Obstetrics & Gynecology | Admitting: Obstetrics & Gynecology

## 2019-09-26 ENCOUNTER — Ambulatory Visit (INDEPENDENT_AMBULATORY_CARE_PROVIDER_SITE_OTHER): Payer: Medicaid Other | Admitting: Medical

## 2019-09-26 VITALS — BP 127/83 | HR 101 | Wt 203.0 lb

## 2019-09-26 DIAGNOSIS — O09529 Supervision of elderly multigravida, unspecified trimester: Secondary | ICD-10-CM | POA: Diagnosis not present

## 2019-09-26 DIAGNOSIS — Z6791 Unspecified blood type, Rh negative: Secondary | ICD-10-CM

## 2019-09-26 DIAGNOSIS — O09523 Supervision of elderly multigravida, third trimester: Secondary | ICD-10-CM

## 2019-09-26 DIAGNOSIS — O99280 Endocrine, nutritional and metabolic diseases complicating pregnancy, unspecified trimester: Secondary | ICD-10-CM

## 2019-09-26 DIAGNOSIS — O133 Gestational [pregnancy-induced] hypertension without significant proteinuria, third trimester: Secondary | ICD-10-CM

## 2019-09-26 DIAGNOSIS — O285 Abnormal chromosomal and genetic finding on antenatal screening of mother: Secondary | ICD-10-CM

## 2019-09-26 DIAGNOSIS — E039 Hypothyroidism, unspecified: Secondary | ICD-10-CM

## 2019-09-26 DIAGNOSIS — Z3A35 35 weeks gestation of pregnancy: Secondary | ICD-10-CM

## 2019-09-26 NOTE — Addendum Note (Signed)
Addended by: Thom Chimes on: 09/26/2019 11:17 AM   Modules accepted: Orders

## 2019-09-26 NOTE — Patient Instructions (Signed)
Fetal Movement Counts Patient Name: ________________________________________________ Patient Due Date: ____________________ What is a fetal movement count?  A fetal movement count is the number of times that you feel your baby move during a certain amount of time. This may also be called a fetal kick count. A fetal movement count is recommended for every pregnant woman. You may be asked to start counting fetal movements as early as week 28 of your pregnancy. Pay attention to when your baby is most active. You may notice your baby's sleep and wake cycles. You may also notice things that make your baby move more. You should do a fetal movement count:  When your baby is normally most active.  At the same time each day. A good time to count movements is while you are resting, after having something to eat and drink. How do I count fetal movements? 1. Find a quiet, comfortable area. Sit, or lie down on your side. 2. Write down the date, the start time and stop time, and the number of movements that you felt between those two times. Take this information with you to your health care visits. 3. Write down your start time when you feel the first movement. 4. Count kicks, flutters, swishes, rolls, and jabs. You should feel at least 10 movements. 5. You may stop counting after you have felt 10 movements, or if you have been counting for 2 hours. Write down the stop time. 6. If you do not feel 10 movements in 2 hours, contact your health care provider for further instructions. Your health care provider may want to do additional tests to assess your baby's well-being. Contact a health care provider if:  You feel fewer than 10 movements in 2 hours.  Your baby is not moving like he or she usually does. Date: ____________ Start time: ____________ Stop time: ____________ Movements: ____________ Date: ____________ Start time: ____________ Stop time: ____________ Movements: ____________ Date: ____________  Start time: ____________ Stop time: ____________ Movements: ____________ Date: ____________ Start time: ____________ Stop time: ____________ Movements: ____________ Date: ____________ Start time: ____________ Stop time: ____________ Movements: ____________ Date: ____________ Start time: ____________ Stop time: ____________ Movements: ____________ Date: ____________ Start time: ____________ Stop time: ____________ Movements: ____________ Date: ____________ Start time: ____________ Stop time: ____________ Movements: ____________ Date: ____________ Start time: ____________ Stop time: ____________ Movements: ____________ This information is not intended to replace advice given to you by your health care provider. Make sure you discuss any questions you have with your health care provider. Document Revised: 12/05/2018 Document Reviewed: 12/05/2018 Elsevier Patient Education  2020 [employer] Braxton Hicks Contractions Contractions of the uterus can occur throughout pregnancy, but they are not always a sign that you are in labor. You may have practice contractions called Braxton Hicks contractions. These false labor contractions are sometimes confused with true labor. What are Braxton Hicks contractions? Braxton Hicks contractions are tightening movements that occur in the muscles of the uterus before labor. Unlike true labor contractions, these contractions do not result in opening (dilation) and thinning of the cervix. Toward the end of pregnancy (32-34 weeks), Braxton Hicks contractions can happen more often and may become stronger. These contractions are sometimes difficult to tell apart from true labor because they can be very uncomfortable. You should not feel embarrassed if you go to the hospital with false labor. Sometimes, the only way to tell if you are in true labor is for your health care provider to look for changes in the cervix. The health care provider   will do a physical exam and may  monitor your contractions. If you are not in true labor, the exam should show that your cervix is not dilating and your water has not broken. If there are no other health problems associated with your pregnancy, it is completely safe for you to be sent home with false labor. You may continue to have Braxton Hicks contractions until you go into true labor. How to tell the difference between true labor and false labor True labor  Contractions last 30-70 seconds.  Contractions become very regular.  Discomfort is usually felt in the top of the uterus, and it spreads to the lower abdomen and low back.  Contractions do not go away with walking.  Contractions usually become more intense and increase in frequency.  The cervix dilates and gets thinner. False labor  Contractions are usually shorter and not as strong as true labor contractions.  Contractions are usually irregular.  Contractions are often felt in the front of the lower abdomen and in the groin.  Contractions may go away when you walk around or change positions while lying down.  Contractions get weaker and are shorter-lasting as time goes on.  The cervix usually does not dilate or become thin. Follow these instructions at home:   Take over-the-counter and prescription medicines only as told by your health care provider.  Keep up with your usual exercises and follow other instructions from your health care provider.  Eat and drink lightly if you think you are going into labor.  If Braxton Hicks contractions are making you uncomfortable: ? Change your position from lying down or resting to walking, or change from walking to resting. ? Sit and rest in a tub of warm water. ? Drink enough fluid to keep your urine pale yellow. Dehydration may cause these contractions. ? Do slow and deep breathing several times an hour.  Keep all follow-up prenatal visits as told by your health care provider. This is important. Contact a  health care provider if:  You have a fever.  You have continuous pain in your abdomen. Get help right away if:  Your contractions become stronger, more regular, and closer together.  You have fluid leaking or gushing from your vagina.  You pass blood-tinged mucus (bloody show).  You have bleeding from your vagina.  You have low back pain that you never had before.  You feel your baby's head pushing down and causing pelvic pressure.  Your baby is not moving inside you as much as it used to. Summary  Contractions that occur before labor are called Braxton Hicks contractions, false labor, or practice contractions.  Braxton Hicks contractions are usually shorter, weaker, farther apart, and less regular than true labor contractions. True labor contractions usually become progressively stronger and regular, and they become more frequent.  Manage discomfort from Braxton Hicks contractions by changing position, resting in a warm bath, drinking plenty of water, or practicing deep breathing. This information is not intended to replace advice given to you by your health care provider. Make sure you discuss any questions you have with your health care provider. Document Revised: 03/30/2017 Document Reviewed: 08/31/2016 Elsevier Patient Education  2020 [employer] Inc[employer]  

## 2019-09-26 NOTE — Progress Notes (Signed)
   PRENATAL VISIT NOTE  Subjective:  Krista Carr is a 39 y.o. G1P0 at [redacted]w[redacted]d being seen today for ongoing prenatal care.  She is currently monitored for the following issues for this high-risk pregnancy and has High-risk pregnancy, multigravida of advanced maternal age, antepartum; Abnormal chromosomal and genetic finding on antenatal screening mother; Hypothyroidism affecting pregnancy, antepartum; Rh negative state in antepartum period; and Gestational (pregnancy-induced) hypertension without significant proteinuria, third trimester on their problem list.  Patient reports no complaints.  Contractions: Not present. Vag. Bleeding: None.  Movement: Present. Denies leaking of fluid.   The following portions of the patient's history were reviewed and updated as appropriate: allergies, current medications, past family history, past medical history, past social history, past surgical history and problem list.   Objective:   Vitals:   09/26/19 0918  BP: 127/83  Pulse: (!) 101  Weight: 203 lb (92.1 kg)    Fetal Status: Fetal Heart Rate (bpm): 140   Movement: Present     General:  Alert, oriented and cooperative. Patient is in no acute distress.  Skin: Skin is warm and dry. No rash noted.   Cardiovascular: Normal heart rate noted  Respiratory: Normal respiratory effort, no problems with respiration noted  Abdomen: Soft, gravid, appropriate for gestational age.  Pain/Pressure: Absent     Pelvic: Cervical exam performed in the presence of a chaperone       closed/50/-2  Extremities: Normal range of motion.  Edema: Trace  Mental Status: Normal mood and affect. Normal behavior. Normal judgment and thought content.   Assessment and Plan:  Pregnancy: G1P0 at [redacted]w[redacted]d 1. Gestational (pregnancy-induced) hypertension without significant proteinuria, third trimester - Diagnosed in MAU this week - IOL at 37-37.6 - [place] MFM FETAL BPP W/NONSTRESS; next week  2. Rh negative state in antepartum period -  Had Rhogam at 28 weeks - Rhogam again PP, if indicated   3. Hypothyroidism affecting pregnancy, antepartum - TSH last month   4. High-risk pregnancy, multigravida of advanced maternal age, antepartum - Culture, beta strep (group b only) - Cervicovaginal ancillary only( [facility]) - Discussed the importance of choosing a pediatrician prior to delivery   5. 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 Please refer to After Visit Summary for other counseling recommendations.   Return in about 1 week (around 10/03/2019) for [facility] APP.  Future Appointments  Date Time Provider Department Center  10/07/2019 11:00 AM Conan Bowens, MD [facility] None    Vonzella Nipple, PA-C

## 2019-09-26 NOTE — Telephone Encounter (Signed)
Preadmission screen  

## 2019-09-26 NOTE — Progress Notes (Signed)
Patient presents for follow up from ED visit on 5/25 for hypertension. Patient has no other concerns.

## 2019-09-26 NOTE — Progress Notes (Signed)
Induction Assessment Scheduling Form: Fax to [facility]:  [phone] Route to [facility] Labor Delivery   Krista Carr                                                                                   DOB:  [birth year]                                                            MRN:  [id number]  Phone:  Home Phone [phone]  Mobile [phone]    Provider:  [facility] ([facility]ulty [facility])  Admission Date/Time:  6/8 or 6/10 midnight (whichever has less FP IOLs) GP:  G1P0     Gestational age on admission:  37+                                                Estimated Date of Delivery: 10/26/19  Dating Criteria: IUI date  Filed Weights   09/26/19 8466  Weight: 203 lb (92.1 kg)    GBS:   pending, obtained 09/26/19 HIV:  Non Reactive (04/12 0934)  Reason for induction:  GHTN Scheduling Provider Signature:  Vonzella Nipple, PA-C     Cervix: closed/50/-2 - vertex  Method of induction(proposed):  Cytotec   Scheduling Provider Signature:  Vonzella Nipple, PA-C                                            Today's Date:  09/26/2019

## 2019-09-30 LAB — CULTURE, BETA STREP (GROUP B ONLY): Strep Gp B Culture: NEGATIVE

## 2019-10-01 ENCOUNTER — Encounter: Payer: Medicaid Other | Admitting: Obstetrics and Gynecology

## 2019-10-01 LAB — CERVICOVAGINAL ANCILLARY ONLY
Bacterial Vaginitis (gardnerella): NEGATIVE
Candida Glabrata: NEGATIVE
Candida Vaginitis: NEGATIVE
Chlamydia: NEGATIVE
Comment: NEGATIVE
Comment: NEGATIVE
Comment: NEGATIVE
Comment: NEGATIVE
Comment: NEGATIVE
Comment: NORMAL
Neisseria Gonorrhea: NEGATIVE
Trichomonas: NEGATIVE

## 2019-10-03 ENCOUNTER — Ambulatory Visit: Payer: Medicaid Other | Admitting: *Deleted

## 2019-10-03 ENCOUNTER — Ambulatory Visit: Payer: Medicaid Other | Attending: Medical

## 2019-10-03 ENCOUNTER — Other Ambulatory Visit: Payer: Self-pay

## 2019-10-03 DIAGNOSIS — O99280 Endocrine, nutritional and metabolic diseases complicating pregnancy, unspecified trimester: Secondary | ICD-10-CM | POA: Insufficient documentation

## 2019-10-03 DIAGNOSIS — O09813 Supervision of pregnancy resulting from assisted reproductive technology, third trimester: Secondary | ICD-10-CM | POA: Diagnosis not present

## 2019-10-03 DIAGNOSIS — O09529 Supervision of elderly multigravida, unspecified trimester: Secondary | ICD-10-CM | POA: Insufficient documentation

## 2019-10-03 DIAGNOSIS — O2693 Pregnancy related conditions, unspecified, third trimester: Secondary | ICD-10-CM

## 2019-10-03 DIAGNOSIS — O285 Abnormal chromosomal and genetic finding on antenatal screening of mother: Secondary | ICD-10-CM

## 2019-10-03 DIAGNOSIS — E039 Hypothyroidism, unspecified: Secondary | ICD-10-CM | POA: Diagnosis present

## 2019-10-03 DIAGNOSIS — O133 Gestational [pregnancy-induced] hypertension without significant proteinuria, third trimester: Secondary | ICD-10-CM

## 2019-10-03 DIAGNOSIS — O281 Abnormal biochemical finding on antenatal screening of mother: Secondary | ICD-10-CM

## 2019-10-03 DIAGNOSIS — Z3A36 36 weeks gestation of pregnancy: Secondary | ICD-10-CM

## 2019-10-03 DIAGNOSIS — O09513 Supervision of elderly primigravida, third trimester: Secondary | ICD-10-CM

## 2019-10-03 DIAGNOSIS — O99283 Endocrine, nutritional and metabolic diseases complicating pregnancy, third trimester: Secondary | ICD-10-CM

## 2019-10-03 DIAGNOSIS — O26899 Other specified pregnancy related conditions, unspecified trimester: Secondary | ICD-10-CM | POA: Diagnosis present

## 2019-10-03 DIAGNOSIS — Z6791 Unspecified blood type, Rh negative: Secondary | ICD-10-CM | POA: Insufficient documentation

## 2019-10-03 NOTE — Procedures (Signed)
Rosezella Kronick 1981-04-01 [redacted]w[redacted]d  Fetus A Non-Stress Test Interpretation for 10/03/19  Indication: Advanced Maternal Age  Fetal Heart Rate A Mode: External Baseline Rate (A): 125 bpm Variability: Moderate Accelerations: 15 x 15 Decelerations: None Multiple birth?: No  Uterine Activity Mode: Palpation, Toco Contraction Frequency (min): x2 Contraction Duration (sec): 40-70 Contraction Quality: Mild Resting Tone Palpated: Relaxed Resting Time: Adequate  Interpretation (Fetal Testing) Nonstress Test Interpretation: Reactive Comments: Reviewed tracing with Dr. Judeth Cornfield

## 2019-10-04 ENCOUNTER — Other Ambulatory Visit (HOSPITAL_COMMUNITY)
Admission: RE | Admit: 2019-10-04 | Discharge: 2019-10-04 | Disposition: A | Discharge status: Home / Self Care | Payer: Medicaid Other | Source: Ambulatory Visit | Attending: Obstetrics and Gynecology | Admitting: Obstetrics and Gynecology

## 2019-10-04 DIAGNOSIS — Z01812 Encounter for preprocedural laboratory examination: Secondary | ICD-10-CM | POA: Insufficient documentation

## 2019-10-04 DIAGNOSIS — Z20822 Contact with and (suspected) exposure to covid-19: Secondary | ICD-10-CM | POA: Insufficient documentation

## 2019-10-04 LAB — SARS CORONAVIRUS 2 (TAT 6-24 HRS): SARS Coronavirus 2: NEGATIVE

## 2019-10-06 ENCOUNTER — Ambulatory Visit (INDEPENDENT_AMBULATORY_CARE_PROVIDER_SITE_OTHER): Payer: Medicaid Other | Admitting: Obstetrics and Gynecology

## 2019-10-06 ENCOUNTER — Other Ambulatory Visit: Payer: Self-pay

## 2019-10-06 ENCOUNTER — Encounter: Payer: Self-pay | Admitting: Obstetrics and Gynecology

## 2019-10-06 ENCOUNTER — Other Ambulatory Visit: Payer: Self-pay | Admitting: Advanced Practice Midwife

## 2019-10-06 VITALS — BP 151/92 | HR 84 | Wt 205.0 lb

## 2019-10-06 DIAGNOSIS — Z3A37 37 weeks gestation of pregnancy: Secondary | ICD-10-CM

## 2019-10-06 DIAGNOSIS — Z6791 Unspecified blood type, Rh negative: Secondary | ICD-10-CM

## 2019-10-06 DIAGNOSIS — O09529 Supervision of elderly multigravida, unspecified trimester: Secondary | ICD-10-CM

## 2019-10-06 DIAGNOSIS — O09523 Supervision of elderly multigravida, third trimester: Secondary | ICD-10-CM

## 2019-10-06 DIAGNOSIS — O133 Gestational [pregnancy-induced] hypertension without significant proteinuria, third trimester: Secondary | ICD-10-CM

## 2019-10-06 DIAGNOSIS — O99283 Endocrine, nutritional and metabolic diseases complicating pregnancy, third trimester: Secondary | ICD-10-CM

## 2019-10-06 DIAGNOSIS — E039 Hypothyroidism, unspecified: Secondary | ICD-10-CM

## 2019-10-06 NOTE — Progress Notes (Signed)
ROB  GBS & CT  Negative   CC: Nausea B/P elevated   pt denies any HA's,swelling or visual changes.

## 2019-10-06 NOTE — Progress Notes (Signed)
Subjective:  Krista Carr is a 39 y.o. G1P0 at [redacted]w[redacted]d being seen today for ongoing prenatal care.  She is currently monitored for the following issues for this high-risk pregnancy and has High-risk pregnancy, multigravida of advanced maternal age, antepartum; Abnormal chromosomal and genetic finding on antenatal screening mother; Hypothyroidism affecting pregnancy, antepartum; Rh negative state in antepartum period; and Gestational (pregnancy-induced) hypertension without significant proteinuria, third trimester on their problem list.  Patient reports general discomforts of pregnancy. Denies HA or visual changes.  Contractions: Not present. Vag. Bleeding: None.  Movement: Present. Denies leaking of fluid.   The following portions of the patient's history were reviewed and updated as appropriate: allergies, current medications, past family history, past medical history, past social history, past surgical history and problem list. Problem list updated.  Objective:   Vitals:   10/06/19 0944 10/06/19 0955  BP: (!) 140/103 (!) 151/92  Pulse: 87 84  Weight: 205 lb (93 kg)     Fetal Status: Fetal Heart Rate (bpm): 132   Movement: Present     General:  Alert, oriented and cooperative. Patient is in no acute distress.  Skin: Skin is warm and dry. No rash noted.   Cardiovascular: Normal heart rate noted  Respiratory: Normal respiratory effort, no problems with respiration noted  Abdomen: Soft, gravid, appropriate for gestational age. Pain/Pressure: Present     Pelvic:  Cervical exam deferred        Extremities: Normal range of motion.  Edema: None  Mental Status: Normal mood and affect. Normal behavior. Normal judgment and thought content.   Urinalysis:      Assessment and Plan:  Pregnancy: G1P0 at [redacted]w[redacted]d  1. High-risk pregnancy, multigravida of advanced maternal age, antepartum For IOL today at midnight  2. Gestational (pregnancy-induced) hypertension without significant proteinuria, third  trimester As above  3. Hypothyroidism affecting pregnancy, antepartum Stable  4. Rh negative state in antepartum period S/P Rhogam at 28 weeks  Term labor symptoms and general obstetric precautions including but not limited to vaginal bleeding, contractions, leaking of fluid and fetal movement were reviewed in detail with the patient. Please refer to After Visit Summary for other counseling recommendations.  Return in about 4 weeks (around 11/03/2019) for 1 week for BP check, 4 weeks for PP visit.   Hermina Staggers, MD

## 2019-10-06 NOTE — Patient Instructions (Signed)

## 2019-10-07 ENCOUNTER — Encounter (HOSPITAL_COMMUNITY): Payer: Self-pay | Admitting: Family Medicine

## 2019-10-07 ENCOUNTER — Inpatient Hospital Stay (HOSPITAL_COMMUNITY): Payer: Medicaid Other

## 2019-10-07 ENCOUNTER — Inpatient Hospital Stay (HOSPITAL_COMMUNITY)
Admission: AD | Admit: 2019-10-07 | Discharge: 2019-10-11 | DRG: 788 | Disposition: A | Discharge status: Home / Self Care | Payer: Medicaid Other | Attending: Family Medicine | Admitting: Family Medicine

## 2019-10-07 ENCOUNTER — Encounter: Payer: Medicaid Other | Admitting: Obstetrics and Gynecology

## 2019-10-07 DIAGNOSIS — Z6791 Unspecified blood type, Rh negative: Secondary | ICD-10-CM

## 2019-10-07 DIAGNOSIS — Z87891 Personal history of nicotine dependence: Secondary | ICD-10-CM | POA: Diagnosis not present

## 2019-10-07 DIAGNOSIS — O26893 Other specified pregnancy related conditions, third trimester: Secondary | ICD-10-CM | POA: Diagnosis present

## 2019-10-07 DIAGNOSIS — R519 Headache, unspecified: Secondary | ICD-10-CM | POA: Diagnosis not present

## 2019-10-07 DIAGNOSIS — O285 Abnormal chromosomal and genetic finding on antenatal screening of mother: Secondary | ICD-10-CM | POA: Diagnosis present

## 2019-10-07 DIAGNOSIS — O09529 Supervision of elderly multigravida, unspecified trimester: Secondary | ICD-10-CM

## 2019-10-07 DIAGNOSIS — O134 Gestational [pregnancy-induced] hypertension without significant proteinuria, complicating childbirth: Principal | ICD-10-CM | POA: Diagnosis present

## 2019-10-07 DIAGNOSIS — Z3A37 37 weeks gestation of pregnancy: Secondary | ICD-10-CM | POA: Diagnosis not present

## 2019-10-07 DIAGNOSIS — O99893 Other specified diseases and conditions complicating puerperium: Secondary | ICD-10-CM | POA: Diagnosis not present

## 2019-10-07 DIAGNOSIS — E039 Hypothyroidism, unspecified: Secondary | ICD-10-CM | POA: Diagnosis present

## 2019-10-07 DIAGNOSIS — O26899 Other specified pregnancy related conditions, unspecified trimester: Secondary | ICD-10-CM

## 2019-10-07 DIAGNOSIS — O09513 Supervision of elderly primigravida, third trimester: Secondary | ICD-10-CM | POA: Diagnosis not present

## 2019-10-07 DIAGNOSIS — O133 Gestational [pregnancy-induced] hypertension without significant proteinuria, third trimester: Secondary | ICD-10-CM | POA: Diagnosis present

## 2019-10-07 DIAGNOSIS — O99284 Endocrine, nutritional and metabolic diseases complicating childbirth: Secondary | ICD-10-CM | POA: Diagnosis present

## 2019-10-07 DIAGNOSIS — O139 Gestational [pregnancy-induced] hypertension without significant proteinuria, unspecified trimester: Secondary | ICD-10-CM | POA: Diagnosis present

## 2019-10-07 DIAGNOSIS — O99280 Endocrine, nutritional and metabolic diseases complicating pregnancy, unspecified trimester: Secondary | ICD-10-CM | POA: Diagnosis present

## 2019-10-07 DIAGNOSIS — O360931 Maternal care for other rhesus isoimmunization, third trimester, fetus 1: Secondary | ICD-10-CM | POA: Diagnosis not present

## 2019-10-07 LAB — COMPREHENSIVE METABOLIC PANEL
ALT: 13 U/L (ref 0–44)
AST: 19 U/L (ref 15–41)
Albumin: 2.6 g/dL — ABNORMAL LOW (ref 3.5–5.0)
Alkaline Phosphatase: 123 U/L (ref 38–126)
Anion gap: 10 (ref 5–15)
BUN: 5 mg/dL — ABNORMAL LOW (ref 6–20)
CO2: 21 mmol/L — ABNORMAL LOW (ref 22–32)
Calcium: 8.5 mg/dL — ABNORMAL LOW (ref 8.9–10.3)
Chloride: 103 mmol/L (ref 98–111)
Creatinine, Ser: 0.55 mg/dL (ref 0.44–1.00)
GFR calc Af Amer: >60 mL/min (ref >60)
GFR calc non Af Amer: >60 mL/min (ref >60)
Glucose, Bld: 88 mg/dL (ref 70–99)
Potassium: 3.7 mmol/L (ref 3.5–5.1)
Sodium: 134 mmol/L — ABNORMAL LOW (ref 135–145)
Total Bilirubin: 0.3 mg/dL (ref 0.3–1.2)
Total Protein: 5.8 g/dL — ABNORMAL LOW (ref 6.5–8.1)

## 2019-10-07 LAB — CBC
HCT: 31.1 % — ABNORMAL LOW (ref 36.0–46.0)
Hemoglobin: 10.3 g/dL — ABNORMAL LOW (ref 12.0–15.0)
MCH: 28.5 pg (ref 26.0–34.0)
MCHC: 33.1 g/dL (ref 30.0–36.0)
MCV: 86.1 fL (ref 80.0–100.0)
Platelets: 271 10*3/uL (ref 150–400)
RBC: 3.61 MIL/uL — ABNORMAL LOW (ref 3.87–5.11)
RDW: 12.3 % (ref 11.5–15.5)
WBC: 9.1 10*3/uL (ref 4.0–10.5)
nRBC: 0 % (ref 0.0–0.2)

## 2019-10-07 LAB — TYPE AND SCREEN
ABO/RH(D): A NEG
Antibody Screen: POSITIVE

## 2019-10-07 LAB — RPR: RPR Ser Ql: NONREACTIVE

## 2019-10-07 LAB — PROTEIN / CREATININE RATIO, URINE
Creatinine, Urine: 50.73 mg/dL
Protein Creatinine Ratio: 0.2 mg/mg{Cre} — ABNORMAL HIGH (ref 0.00–0.15)
Total Protein, Urine: 10 mg/dL

## 2019-10-07 MED ORDER — LABETALOL HCL 5 MG/ML IV SOLN: 40.0000 mg | INTRAVENOUS | Status: DC | PRN

## 2019-10-07 MED ORDER — LACTATED RINGERS IV SOLN: 500.0000 mL | INTRAVENOUS | Status: DC | PRN

## 2019-10-07 MED ORDER — ONDANSETRON HCL 4 MG/2ML IJ SOLN
4.0000 mg | Freq: Four times a day (QID) | INTRAMUSCULAR | Status: DC | PRN
  Administered 2019-10-08: 4 mg via INTRAVENOUS
  Filled 2019-10-07: qty 2

## 2019-10-07 MED ORDER — OXYCODONE-ACETAMINOPHEN 5-325 MG PO TABS: 2.0000 | ORAL_TABLET | ORAL | Status: DC | PRN

## 2019-10-07 MED ORDER — ZOLPIDEM TARTRATE 5 MG PO TABS: 5.0000 mg | ORAL_TABLET | Freq: Every evening | ORAL | Status: DC | PRN

## 2019-10-07 MED ORDER — SOD CITRATE-CITRIC ACID 500-334 MG/5ML PO SOLN
30.0000 mL | ORAL | Status: DC | PRN
  Administered 2019-10-08 (×2): 30 mL via ORAL
  Filled 2019-10-07 (×2): qty 30

## 2019-10-07 MED ORDER — OXYCODONE-ACETAMINOPHEN 5-325 MG PO TABS: 1.0000 | ORAL_TABLET | ORAL | Status: DC | PRN

## 2019-10-07 MED ORDER — LEVOTHYROXINE SODIUM 75 MCG PO TABS
75.0000 ug | ORAL_TABLET | Freq: Every day | ORAL | Status: DC
  Administered 2019-10-09 – 2019-10-11 (×3): 75 ug via ORAL
  Filled 2019-10-07 (×4): qty 1

## 2019-10-07 MED ORDER — FLEET ENEMA 7-19 GM/118ML RE ENEM: 1.0000 | ENEMA | Freq: Every day | RECTAL | Status: DC | PRN

## 2019-10-07 MED ORDER — LACTATED RINGERS IV SOLN: INTRAVENOUS | Status: DC

## 2019-10-07 MED ORDER — ACETAMINOPHEN 325 MG PO TABS: 650.0000 mg | ORAL_TABLET | ORAL | Status: DC | PRN

## 2019-10-07 MED ORDER — HYDRALAZINE HCL 20 MG/ML IJ SOLN: 10.0000 mg | INTRAMUSCULAR | Status: DC | PRN

## 2019-10-07 MED ORDER — MISOPROSTOL 50MCG HALF TABLET
50.0000 ug | ORAL_TABLET | ORAL | Status: DC | PRN
  Administered 2019-10-07 (×3): 50 ug via BUCCAL
  Filled 2019-10-07 (×3): qty 1

## 2019-10-07 MED ORDER — OXYTOCIN-SODIUM CHLORIDE 30-0.9 UT/500ML-% IV SOLN: 2.5000 [IU]/h | INTRAVENOUS | Status: DC

## 2019-10-07 MED ORDER — HYDROXYZINE HCL 25 MG PO TABS: 50.0000 mg | ORAL_TABLET | Freq: Four times a day (QID) | ORAL | Status: DC | PRN

## 2019-10-07 MED ORDER — LABETALOL HCL 5 MG/ML IV SOLN: 80.0000 mg | INTRAVENOUS | Status: DC | PRN

## 2019-10-07 MED ORDER — OXYTOCIN BOLUS FROM INFUSION: 500.0000 mL | Freq: Once | INTRAVENOUS | Status: DC

## 2019-10-07 MED ORDER — MISOPROSTOL 25 MCG QUARTER TABLET: 25.0000 ug | ORAL_TABLET | ORAL | Status: DC | PRN

## 2019-10-07 MED ORDER — TERBUTALINE SULFATE 1 MG/ML IJ SOLN: 0.2500 mg | Freq: Once | INTRAMUSCULAR | Status: DC | PRN

## 2019-10-07 MED ORDER — FENTANYL CITRATE (PF) 100 MCG/2ML IJ SOLN
50.0000 ug | INTRAMUSCULAR | Status: DC | PRN
  Administered 2019-10-07 (×2): 100 ug via INTRAVENOUS
  Administered 2019-10-07 (×2): 50 ug via INTRAVENOUS
  Administered 2019-10-08 (×7): 100 ug via INTRAVENOUS
  Filled 2019-10-07 (×10): qty 2

## 2019-10-07 MED ORDER — LABETALOL HCL 5 MG/ML IV SOLN: 20.0000 mg | INTRAVENOUS | Status: DC | PRN

## 2019-10-07 MED ORDER — LIDOCAINE HCL (PF) 1 % IJ SOLN: 30.0000 mL | INTRAMUSCULAR | Status: DC | PRN

## 2019-10-07 NOTE — Progress Notes (Signed)
LABOR PROGRESS NOTE  Krista Carr is a 39 y.o. G1P0 at 105w2d  admitted for IOL in setting of GHTN.  Subjective: Patient doing well overall but reports more uncomfortable contractions.   Objective: BP (!) 141/92 (BP Location: Left Arm)   Pulse 79   Temp 98.1 F (36.7 C) (Oral)   Resp 20   Ht 5\' 4"  (1.626 m)   Wt 94.1 kg   LMP 01/19/2019   BMI 35.60 kg/m  or  Vitals:   10/07/19 1104 10/07/19 1224 10/07/19 1305 10/07/19 1410  BP: (!) 142/90 (!) 142/92 (!) 154/92 (!) 141/92  Pulse: 80 79 91 79  Resp:  16 20   Temp:    98.1 F (36.7 C)  TempSrc:    Oral  Weight:      Height:       Dilation: Closed Effacement (%): Thick Station: -3 Presentation: Vertex Exam by:: Fair   FHT: baseline rate 130, moderate varibility, +acel, no decels Toco: every 2 minutes  Labs: Lab Results  Component Value Date   WBC 9.1 10/07/2019   HGB 10.3 (L) 10/07/2019   HCT 31.1 (L) 10/07/2019   MCV 86.1 10/07/2019   PLT 271 10/07/2019    Patient Active Problem List   Diagnosis Date Noted  . Gestational hypertension 10/07/2019  . Gestational (pregnancy-induced) hypertension without significant proteinuria, third trimester 09/26/2019  . Rh negative state in antepartum period 08/25/2019  . Hypothyroidism affecting pregnancy, antepartum 08/11/2019  . High-risk pregnancy, multigravida of advanced maternal age, antepartum 06/26/2019  . Abnormal chromosomal and genetic finding on antenatal screening mother 06/26/2019    Assessment / Plan: 39 y.o. G1P0 at [redacted]w[redacted]d here for IOL due to [facility].  Labor: awaiting balloon expulsion; s/p 1 dose cytotec at 0915, allow patient to continue with labor  Fetal Wellbeing:  Category I  Pain Control:  Labor support without medications, patient waiting for further dilation to epidural  GBS: negative  Anticipated MOD:  Vaginal delivery    [facility], MD Family Medicine, PGY-1  10/07/2019, 2:44 PM

## 2019-10-07 NOTE — Progress Notes (Signed)
Labor Progress Note Krista Carr is a 39 y.o. G1P0 at [redacted]w[redacted]d presented for IOL for gHTN. S: Fairly uncomfortable with ctx. FB now out.   O:  BP (!) 141/92 (BP Location: Left Arm)   Pulse 79   Temp 98.1 F (36.7 C) (Oral)   Resp 20   Ht 5\' 4"  (1.626 m)   Wt 94.1 kg   LMP 01/19/2019   BMI 35.60 kg/m  EFM: 125, moderate variability, pos accels, no decels, reactive TOCO: q3-44m  CVE: Dilation: 4 Effacement (%): 50 Cervical Position: Posterior Station: -3 Presentation: Vertex Exam by:: [email or web address]   A&P: 39 y.o. G1P0 [redacted]w[redacted]d here for IOL for gHTN. #Labor: Progressing well. S/p FB and one Cytotec as she was contracting too much to give another. Now spaced out some and will give a second Cytotec due to cervical thickness. Pit/AROM PRN. Anticipate SVD. #Pain: per patient request #FWB: Cat I #GBS negative #gHTN: Mild-range pressures. Asymptomatic. Repeat CMP and Pr/Cr WNL.  [redacted]w[redacted]d, MD 4:41 PM

## 2019-10-07 NOTE — H&P (Addendum)
OBSTETRIC ADMISSION HISTORY AND PHYSICAL  Krista Carr is a 39 y.o. female G1P0 with IUP at [redacted]w[redacted]d by [place] (IUI pregnancy) presenting for IOL for GHTN. She reports +FMs, No LOF, no VB, no blurry vision, headaches or peripheral edema, and RUQ pain.  She plans on breast feeding. She requests no method for birth control. 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 Hypothyroidism    pregnancy  . Pregnancy induced hypertension     Past Surgical History: Past Surgical History:  Procedure Laterality Date  . Egg Retrieval    . GANGLION CYST EXCISION    . NO PAST SURGERIES    . WISDOM TOOTH EXTRACTION      Obstetrical History: OB History    Gravida  1   Para      Term      Preterm      AB      Living  0     SAB      TAB      Ectopic      Multiple      Live Births              Social History: Social History   Socioeconomic History  . Marital status: Married    Spouse name: Not on file  . Number of children: Not on file  . Years of education: Not on file  . Highest education level: Bachelor's degree (e.g., BA, AB, BS)  Occupational History  . Occupation: self employed    Comment: 06-02-1994  Tobacco Use  . Smoking status: Former Product manager  . Smokeless tobacco: Never Used  Substance and Sexual Activity  . Alcohol use: Never  . Drug use: Never  . Sexual activity: Yes    Birth control/protection: None  Other Topics Concern  . Not on file  Social History Narrative  . Not on file   Social Determinants of Health    Financial Resource Strain:   . Difficulty of Paying Living Expenses:   Food Insecurity:   . Worried About Games developer in the Last Year:   . Programme researcher, broadcasting/film/video in the Last Year:   Transportation Needs:   . Barista (Medical):   Freight forwarder Lack of Transportation (Non-Medical):   Physical Activity:   . Days of Exercise per Week:   . Minutes of Exercise per Session:   Stress:   . Feeling of Stress :   Social Connections:   . Frequency of Communication with Friends and Family:   . Frequency of Social Gatherings with Friends and Family:   . Attends Religious Services:   . Active Member of Clubs or Organizations:   . Attends [employer] Meetings:   Banker Marital Status:     Family History: Family History  Problem Relation Age of Onset  . Multiple sclerosis Mother   . Heart disease Father   . Parkinson's disease Paternal Grandmother     Allergies: No Known Allergies  Medications Prior to Admission  Medication Sig Dispense Refill Last Dose  . Elastic Bandages & Supports (COMFORT  FIT MATERNITY SUPP LG) MISC Wear daily when ambulating 1 each 0   . ferrous sulfate 325 (65 FE) MG tablet Take 325 mg by mouth daily with breakfast.     . levothyroxine (SYNTHROID) 75 MCG tablet Take 1 tablet (75 mcg total) by mouth daily before breakfast. 30 tablet 3   . Prenatal Vit-Fe Fumarate-FA (MULTIVITAMIN-PRENATAL) 27-0.8 MG TABS tablet Take 1 tablet by mouth daily at 12 noon.      Review of Systems   All systems reviewed and negative except as stated in HPI  Blood pressure 122/86, pulse 85, temperature 98.1 F (36.7 C), temperature source Oral, resp. rate 16, height 5\' 4"  (1.626 m), last menstrual period 01/19/2019. General appearance: alert, cooperative, appears stated age and no distress Lungs: normal effort Heart: regular rate  Abdomen: soft, non-tender; bowel sounds normal Pelvic: gravid uterus Extremities: Homans sign is negative, no sign of DVT Presentation:  cephalic Fetal monitoringBaseline: 120 bpm, Variability: Good {> 6 bpm), Accelerations: Reactive and Decelerations: Absent Uterine activity: Frequency: Every 1 minute Dilation: Closed Effacement (%): Thick Station: -3 Exam by:: Jasa Dundon  Prenatal labs: ABO, Rh: A/Negative/-- (11/30 0000) Antibody: Negative (11/30 0000) Rubella: Immune (11/30 0000) RPR: Non Reactive (04/12 0934)  HBsAg: Positive (11/30 0000)  HIV: Non Reactive (04/12 0934)  GBS: Negative/-- (05/28 1004)  2 hr Glucola normal  Genetic screening: 1:6 risk for DS, referred to genetic counselor  Anatomy US normal   Prenatal Transfer Tool  Maternal Diabetes: No Genetic Screening: Abnormal:  Results: Elevated risk of Trisomy 21 Maternal Ultrasounds/Referrals: Other:elevated risk for trisomy 21 Fetal Ultrasounds or other Referrals:  Fetal echo, reported as normal  Maternal Substance Abuse:  No Significant Maternal Medications:  Meds include: Syntroid Significant Maternal Lab Results: Group B Strep negative  Results for orders placed or performed during the hospital encounter of 10/07/19 (from the past 24 hour(s))  CBC   Collection Time: 10/07/19  8:35 AM  Result Value Ref Range   WBC 9.1 4.0 - 10.5 K/uL   RBC 3.61 (L) 3.87 - 5.11 MIL/uL   Hemoglobin 10.3 (L) 12.0 - 15.0 g/dL   HCT 31.1 (L) 36.0 - 46.0 %   MCV 86.1 80.0 - 100.0 fL   MCH 28.5 26.0 - 34.0 pg   MCHC 33.1 30.0 - 36.0 g/dL   RDW 12.3 11.5 - 15.5 %   Platelets 271 150 - 400 K/uL   nRBC 0.0 0.0 - 0.2 %    Patient Active Problem List   Diagnosis Date Noted  . Gestational hypertension 10/07/2019  . Gestational (pregnancy-induced) hypertension without significant proteinuria, third trimester 09/26/2019  . Rh negative state in antepartum period 08/25/2019  . Hypothyroidism affecting pregnancy, antepartum 08/11/2019  . High-risk pregnancy, multigravida of advanced maternal age, antepartum 06/26/2019  . Abnormal chromosomal and genetic finding on antenatal  screening mother 06/26/2019    Assessment/Plan:  Krista Carr is a 39 y.o. G1P0 at [redacted]w[redacted]d here for IOL 2/2 to [facility]   #Labor: IOL with cytotec and foley balloon  #Pain: pt is open to epidural  #FWB: Category I ; # EFW: 7lb #ID:  GBS negative  #MOF: breast  #MOC: none #Circ:  yes #GHTN: monitor BP, will plan to start antihypertensive, last BP  #A Neg: received Rhogam at [redacted]w[redacted]d, PP rhogam evaluation  #Hypothyroidism: continue home Synthroid 75 mcg daily   Stark Klein, MD Family Medicine, PGY-1 10/07/2019, 9:21 AM   I saw and evaluated the patient. I agree with the findings and the plan of  care as documented in the resident's note. Vertex by exam. Foley bulb placed with speculum and filled with 60 mL water; patient tolerated well. EFW 3300g. Will repeat HTN labs. CMP WNL, pending Pr/Cr ratio. Anticipate SVD.   Jerilynn Birkenhead, MD [facility] Fellow, [facility] for [employer], [facility]

## 2019-10-07 NOTE — Progress Notes (Signed)
LABOR PROGRESS NOTE  Krista Carr is a 39 y.o. G1P0 at [redacted]w[redacted]d  admitted for IOL in setting of GHTN.  Subjective: Patient doing well.   Objective: BP (!) 154/92   Pulse 91   Temp 98.1 F (36.7 C) (Oral)   Resp 20   Ht 5\' 4"  (1.626 m)   Wt 94.1 kg   LMP 01/19/2019   BMI 35.60 kg/m  or  Vitals:   10/07/19 0830 10/07/19 1104 10/07/19 1224 10/07/19 1305  BP: 122/86 (!) 142/90 (!) 142/92 (!) 154/92  Pulse: 85 80 79 91  Resp: 16  16 20   Temp: 98.1 F (36.7 C)     TempSrc: Oral     Weight: 94.1 kg     Height: 5\' 4"  (1.626 m)       Dilation: Closed Effacement (%): Thick Station: -3 Presentation: Vertex Exam by:: Fair FHT: baseline rate 140, moderate varibility, +acel, no decel Toco: every 2 minutes  Labs: Lab Results  Component Value Date   WBC 9.1 10/07/2019   HGB 10.3 (L) 10/07/2019   HCT 31.1 (L) 10/07/2019   MCV 86.1 10/07/2019   PLT 271 10/07/2019    Patient Active Problem List   Diagnosis Date Noted  . Gestational hypertension 10/07/2019  . Gestational (pregnancy-induced) hypertension without significant proteinuria, third trimester 09/26/2019  . Rh negative state in antepartum period 08/25/2019  . Hypothyroidism affecting pregnancy, antepartum 08/11/2019  . High-risk pregnancy, multigravida of advanced maternal age, antepartum 06/26/2019  . Abnormal chromosomal and genetic finding on antenatal screening mother 06/26/2019    Assessment / Plan: 89 y.o. G1P0 at [redacted]w[redacted]d here for IOL due to [facility].  Labor: s/p foley balloon, awaiting balloon expulsion; s/p 1 dose cytotec at 0915, allow patient to continue with labor  Fetal Wellbeing:  Category I  Pain Control:  Labor support GBS: negative  Anticipated MOD:  Vaginal delivery    24, MD Family Medicine, PGY-1  10/07/2019, 1:13 PM

## 2019-10-07 NOTE — Progress Notes (Signed)
Patient ID: Krista Carr, female   DOB: 1980/05/16, 39 y.o.   MRN: [id number]  Krista Carr is a 39 y.o. G1P0 at [redacted]w[redacted]d presented for IOL for gHTN.  Subjective: Feeling some more frequent contractions, but very tolerable.    Objective: BP (!) 142/90   Pulse 94   Temp 98.5 F (36.9 C) (Oral)   Resp 17   Ht 5\' 4"  (1.626 m)   Wt 94.1 kg   LMP 01/19/2019   BMI 35.60 kg/m  No intake/output data recorded.  FHT:  FHR: 155 bpm, variability: moderate,  accelerations:  Present,  decelerations:  Absent UC:   regular, every 2-3 minutes  SVE:   Dilation: 4 Effacement (%): 50 Station: -3 Exam by:: Dr. [email or web address]   Labs: Lab Results  Component Value Date   WBC 9.1 10/07/2019   HGB 10.3 (L) 10/07/2019   HCT 31.1 (L) 10/07/2019   MCV 86.1 10/07/2019   PLT 271 10/07/2019    Assessment / Plan: 39 y.o. G1P0 [redacted]w[redacted]d here for IOL for gHTN.  Labor: Progressing well.  Give Cytotec #3 as cervix is still firm.   Fetal Wellbeing:  Category I Pain Control:  IV pain meds, epidural upon request Pre-eclampsia: No severe range BP.  Pre-E labs 6/8 with no significant change from prior.  Remains asymptomatic.  Continue to monitor.  I/D:  GBS negative Anticipated MOD:  Vaginal delivery    Linford Quintela 8/8, MD PGY-2 Resident Family Medicine 10/07/2019, 8:47 PM

## 2019-10-08 ENCOUNTER — Inpatient Hospital Stay (HOSPITAL_COMMUNITY): Payer: Medicaid Other | Admitting: Anesthesiology

## 2019-10-08 ENCOUNTER — Encounter (HOSPITAL_COMMUNITY): Payer: Self-pay | Admitting: Family Medicine

## 2019-10-08 ENCOUNTER — Encounter (HOSPITAL_COMMUNITY)
Admission: AD | Disposition: A | Discharge status: Home / Self Care | Payer: Self-pay | Source: Home / Self Care | Attending: Family Medicine

## 2019-10-08 DIAGNOSIS — O360931 Maternal care for other rhesus isoimmunization, third trimester, fetus 1: Secondary | ICD-10-CM

## 2019-10-08 DIAGNOSIS — O134 Gestational [pregnancy-induced] hypertension without significant proteinuria, complicating childbirth: Secondary | ICD-10-CM

## 2019-10-08 DIAGNOSIS — Z3A37 37 weeks gestation of pregnancy: Secondary | ICD-10-CM

## 2019-10-08 DIAGNOSIS — O09513 Supervision of elderly primigravida, third trimester: Secondary | ICD-10-CM

## 2019-10-08 LAB — CBC
HCT: 31 % — ABNORMAL LOW (ref 36.0–46.0)
HCT: 32.6 % — ABNORMAL LOW (ref 36.0–46.0)
HCT: 26.2 % — ABNORMAL LOW (ref 36.0–46.0)
Hemoglobin: 10.3 g/dL — ABNORMAL LOW (ref 12.0–15.0)
Hemoglobin: 10.6 g/dL — ABNORMAL LOW (ref 12.0–15.0)
Hemoglobin: 8.5 g/dL — ABNORMAL LOW (ref 12.0–15.0)
MCH: 28.9 pg (ref 26.0–34.0)
MCH: 28.6 pg (ref 26.0–34.0)
MCH: 28.9 pg (ref 26.0–34.0)
MCHC: 33.2 g/dL (ref 30.0–36.0)
MCHC: 32.5 g/dL (ref 30.0–36.0)
MCHC: 32.4 g/dL (ref 30.0–36.0)
MCV: 87.1 fL (ref 80.0–100.0)
MCV: 87.9 fL (ref 80.0–100.0)
MCV: 89.1 fL (ref 80.0–100.0)
Platelets: 266 10*3/uL (ref 150–400)
Platelets: 297 10*3/uL (ref 150–400)
Platelets: 229 10*3/uL (ref 150–400)
RBC: 3.56 MIL/uL — ABNORMAL LOW (ref 3.87–5.11)
RBC: 3.71 MIL/uL — ABNORMAL LOW (ref 3.87–5.11)
RBC: 2.94 MIL/uL — ABNORMAL LOW (ref 3.87–5.11)
RDW: 12.4 % (ref 11.5–15.5)
RDW: 12.5 % (ref 11.5–15.5)
RDW: 12.5 % (ref 11.5–15.5)
WBC: 16.9 10*3/uL — ABNORMAL HIGH (ref 4.0–10.5)
WBC: 17.1 10*3/uL — ABNORMAL HIGH (ref 4.0–10.5)
WBC: 16 10*3/uL — ABNORMAL HIGH (ref 4.0–10.5)
nRBC: 0 % (ref 0.0–0.2)
nRBC: 0.1 % (ref 0.0–0.2)
nRBC: 0.1 % (ref 0.0–0.2)

## 2019-10-08 SURGERY — Surgical Case
Anesthesia: Epidural | Site: Abdomen | Wound class: Clean Contaminated

## 2019-10-08 MED ORDER — EPHEDRINE 5 MG/ML INJ: 10.0000 mg | INTRAVENOUS | Status: DC | PRN

## 2019-10-08 MED ORDER — LIDOCAINE-EPINEPHRINE (PF) 2 %-1:200000 IJ SOLN
INTRAMUSCULAR | Status: AC
  Filled 2019-10-08: qty 10

## 2019-10-08 MED ORDER — LIDOCAINE HCL (PF) 1 % IJ SOLN
INTRAMUSCULAR | Status: DC | PRN
  Administered 2019-10-08: 5 mL via EPIDURAL
  Administered 2019-10-08: 2 mL via EPIDURAL
  Administered 2019-10-08: 3 mL via EPIDURAL

## 2019-10-08 MED ORDER — NALBUPHINE HCL 10 MG/ML IJ SOLN: 5.0000 mg | Freq: Once | INTRAMUSCULAR | Status: DC | PRN

## 2019-10-08 MED ORDER — PROPOFOL 10 MG/ML IV BOLUS
INTRAVENOUS | Status: DC | PRN
  Administered 2019-10-08: 200 mg via INTRAVENOUS

## 2019-10-08 MED ORDER — NALBUPHINE HCL 10 MG/ML IJ SOLN: 5.0000 mg | INTRAMUSCULAR | Status: DC | PRN

## 2019-10-08 MED ORDER — FENTANYL-BUPIVACAINE-NACL 0.5-0.125-0.9 MG/250ML-% EP SOLN: 12.0000 mL/h | EPIDURAL | Status: DC | PRN

## 2019-10-08 MED ORDER — LACTATED RINGERS IV SOLN: INTRAVENOUS | Status: DC

## 2019-10-08 MED ORDER — OXYTOCIN-SODIUM CHLORIDE 30-0.9 UT/500ML-% IV SOLN
INTRAVENOUS | Status: AC
  Filled 2019-10-08: qty 500

## 2019-10-08 MED ORDER — DIPHENHYDRAMINE HCL 50 MG/ML IJ SOLN: 12.5000 mg | INTRAMUSCULAR | Status: DC | PRN

## 2019-10-08 MED ORDER — KETOROLAC TROMETHAMINE 30 MG/ML IJ SOLN
INTRAMUSCULAR | Status: AC
  Filled 2019-10-08: qty 1

## 2019-10-08 MED ORDER — SIMETHICONE 80 MG PO CHEW
80.0000 mg | CHEWABLE_TABLET | ORAL | Status: DC
  Administered 2019-10-09 – 2019-10-10 (×3): 80 mg via ORAL
  Filled 2019-10-08 (×3): qty 1

## 2019-10-08 MED ORDER — MIDAZOLAM HCL 2 MG/2ML IJ SOLN
INTRAMUSCULAR | Status: DC | PRN
  Administered 2019-10-08: 1 mg via INTRAVENOUS

## 2019-10-08 MED ORDER — SODIUM CHLORIDE (PF) 0.9 % IJ SOLN
INTRAMUSCULAR | Status: DC | PRN
  Administered 2019-10-08: 12 mL/h via EPIDURAL

## 2019-10-08 MED ORDER — DIPHENHYDRAMINE HCL 25 MG PO CAPS: 25.0000 mg | ORAL_CAPSULE | ORAL | Status: DC | PRN

## 2019-10-08 MED ORDER — OXYTOCIN-SODIUM CHLORIDE 30-0.9 UT/500ML-% IV SOLN
INTRAVENOUS | Status: DC | PRN
  Administered 2019-10-08: 500 mL via INTRAVENOUS

## 2019-10-08 MED ORDER — PROMETHAZINE HCL 25 MG/ML IJ SOLN
25.0000 mg | Freq: Four times a day (QID) | INTRAMUSCULAR | Status: DC | PRN
  Administered 2019-10-08: 25 mg via INTRAVENOUS
  Filled 2019-10-08: qty 1

## 2019-10-08 MED ORDER — ENOXAPARIN SODIUM 40 MG/0.4ML ~~LOC~~ SOLN
40.0000 mg | SUBCUTANEOUS | Status: DC
  Administered 2019-10-09 – 2019-10-11 (×3): 40 mg via SUBCUTANEOUS
  Filled 2019-10-08 (×3): qty 0.4

## 2019-10-08 MED ORDER — STERILE WATER FOR IRRIGATION IR SOLN
Status: DC | PRN
  Administered 2019-10-08: 1000 mL

## 2019-10-08 MED ORDER — CEFAZOLIN SODIUM-DEXTROSE 2-3 GM-%(50ML) IV SOLR
INTRAVENOUS | Status: DC | PRN
Start: 2019-10-08 — End: 2019-10-08
  Administered 2019-10-08: 2 g via INTRAVENOUS

## 2019-10-08 MED ORDER — ONDANSETRON HCL 4 MG/2ML IJ SOLN
INTRAMUSCULAR | Status: DC | PRN
  Administered 2019-10-08: 4 mg via INTRAVENOUS

## 2019-10-08 MED ORDER — WITCH HAZEL-GLYCERIN EX PADS: 1.0000 "application " | MEDICATED_PAD | CUTANEOUS | Status: DC | PRN

## 2019-10-08 MED ORDER — NALOXONE HCL 0.4 MG/ML IJ SOLN: 0.4000 mg | INTRAMUSCULAR | Status: DC | PRN

## 2019-10-08 MED ORDER — FENTANYL CITRATE (PF) 100 MCG/2ML IJ SOLN: 25.0000 ug | INTRAMUSCULAR | Status: DC | PRN

## 2019-10-08 MED ORDER — DIBUCAINE (PERIANAL) 1 % EX OINT: 1.0000 "application " | TOPICAL_OINTMENT | CUTANEOUS | Status: DC | PRN

## 2019-10-08 MED ORDER — SODIUM CHLORIDE 0.9 % IR SOLN
Status: DC | PRN
  Administered 2019-10-08: 1000 mL

## 2019-10-08 MED ORDER — ONDANSETRON HCL 4 MG/2ML IJ SOLN: 4.0000 mg | Freq: Three times a day (TID) | INTRAMUSCULAR | Status: DC | PRN

## 2019-10-08 MED ORDER — SIMETHICONE 80 MG PO CHEW: 80.0000 mg | CHEWABLE_TABLET | ORAL | Status: DC | PRN

## 2019-10-08 MED ORDER — METOCLOPRAMIDE HCL 5 MG/ML IJ SOLN
INTRAMUSCULAR | Status: DC | PRN
  Administered 2019-10-08: 10 mg via INTRAVENOUS

## 2019-10-08 MED ORDER — MISOPROSTOL 100 MCG PO TABS
25.0000 ug | ORAL_TABLET | ORAL | Status: DC
  Administered 2019-10-08 (×2): 25 ug via VAGINAL
  Filled 2019-10-08: qty 1

## 2019-10-08 MED ORDER — DEXAMETHASONE SODIUM PHOSPHATE 4 MG/ML IJ SOLN
INTRAMUSCULAR | Status: AC
  Filled 2019-10-08: qty 1

## 2019-10-08 MED ORDER — LIDOCAINE-EPINEPHRINE (PF) 2 %-1:200000 IJ SOLN
INTRAMUSCULAR | Status: DC | PRN
Start: 2019-10-08 — End: 2019-10-08
  Administered 2019-10-08 (×2): 5 mL via INTRADERMAL
  Administered 2019-10-08: 3 mL via INTRADERMAL

## 2019-10-08 MED ORDER — CEFAZOLIN SODIUM-DEXTROSE 2-4 GM/100ML-% IV SOLN
INTRAVENOUS | Status: AC
  Filled 2019-10-08: qty 100

## 2019-10-08 MED ORDER — NIFEDIPINE ER OSMOTIC RELEASE 30 MG PO TB24
30.0000 mg | ORAL_TABLET | Freq: Every day | ORAL | Status: DC
  Administered 2019-10-08 – 2019-10-11 (×4): 30 mg via ORAL
  Filled 2019-10-08 (×5): qty 1

## 2019-10-08 MED ORDER — MORPHINE SULFATE (PF) 0.5 MG/ML IJ SOLN
INTRAMUSCULAR | Status: DC | PRN
  Administered 2019-10-08: 3 mg via EPIDURAL

## 2019-10-08 MED ORDER — PROPOFOL 10 MG/ML IV BOLUS
INTRAVENOUS | Status: AC
  Filled 2019-10-08: qty 20

## 2019-10-08 MED ORDER — OXYTOCIN-SODIUM CHLORIDE 30-0.9 UT/500ML-% IV SOLN
2.5000 [IU]/h | INTRAVENOUS | Status: AC
  Administered 2019-10-08: 2.5 [IU]/h via INTRAVENOUS

## 2019-10-08 MED ORDER — SCOPOLAMINE 1 MG/3DAYS TD PT72
1.0000 | MEDICATED_PATCH | Freq: Once | TRANSDERMAL | Status: AC
  Administered 2019-10-08: 1.5 mg via TRANSDERMAL

## 2019-10-08 MED ORDER — DEXAMETHASONE SODIUM PHOSPHATE 10 MG/ML IJ SOLN
INTRAMUSCULAR | Status: DC | PRN
  Administered 2019-10-08: 10 mg via INTRAVENOUS

## 2019-10-08 MED ORDER — SENNOSIDES-DOCUSATE SODIUM 8.6-50 MG PO TABS
2.0000 | ORAL_TABLET | ORAL | Status: DC
  Administered 2019-10-09 – 2019-10-10 (×2): 2 via ORAL
  Filled 2019-10-08 (×2): qty 2

## 2019-10-08 MED ORDER — ACETAMINOPHEN 500 MG PO TABS
1000.0000 mg | ORAL_TABLET | Freq: Four times a day (QID) | ORAL | Status: AC
  Administered 2019-10-09 (×3): 1000 mg via ORAL
  Filled 2019-10-08 (×4): qty 2

## 2019-10-08 MED ORDER — FENTANYL-BUPIVACAINE-NACL 0.5-0.125-0.9 MG/250ML-% EP SOLN
EPIDURAL | Status: AC
  Filled 2019-10-08: qty 250

## 2019-10-08 MED ORDER — SUCCINYLCHOLINE CHLORIDE 200 MG/10ML IV SOSY
PREFILLED_SYRINGE | INTRAVENOUS | Status: AC
  Filled 2019-10-08: qty 10

## 2019-10-08 MED ORDER — PHENYLEPHRINE 40 MCG/ML (10ML) SYRINGE FOR IV PUSH (FOR BLOOD PRESSURE SUPPORT): 80.0000 ug | PREFILLED_SYRINGE | INTRAVENOUS | Status: DC | PRN

## 2019-10-08 MED ORDER — MENTHOL 3 MG MT LOZG
1.0000 | LOZENGE | OROMUCOSAL | Status: DC | PRN
  Administered 2019-10-11: 3 mg via ORAL
  Filled 2019-10-08: qty 9

## 2019-10-08 MED ORDER — NALOXONE HCL 4 MG/10ML IJ SOLN
1.0000 ug/kg/h | INTRAVENOUS | Status: DC | PRN
  Filled 2019-10-08: qty 5

## 2019-10-08 MED ORDER — SODIUM CHLORIDE 0.9% FLUSH: 3.0000 mL | INTRAVENOUS | Status: DC | PRN

## 2019-10-08 MED ORDER — DIPHENHYDRAMINE HCL 25 MG PO CAPS: 25.0000 mg | ORAL_CAPSULE | Freq: Four times a day (QID) | ORAL | Status: DC | PRN

## 2019-10-08 MED ORDER — HYDROCODONE-ACETAMINOPHEN 5-325 MG PO TABS: 1.0000 | ORAL_TABLET | ORAL | Status: DC | PRN

## 2019-10-08 MED ORDER — MEPERIDINE HCL 25 MG/ML IJ SOLN: 6.2500 mg | INTRAMUSCULAR | Status: DC | PRN

## 2019-10-08 MED ORDER — SCOPOLAMINE 1 MG/3DAYS TD PT72
MEDICATED_PATCH | TRANSDERMAL | Status: AC
  Filled 2019-10-08: qty 1

## 2019-10-08 MED ORDER — IBUPROFEN 800 MG PO TABS
800.0000 mg | ORAL_TABLET | Freq: Three times a day (TID) | ORAL | Status: DC
  Filled 2019-10-08: qty 1

## 2019-10-08 MED ORDER — ONDANSETRON HCL 4 MG/2ML IJ SOLN
INTRAMUSCULAR | Status: AC
  Filled 2019-10-08: qty 2

## 2019-10-08 MED ORDER — METOCLOPRAMIDE HCL 5 MG/ML IJ SOLN
INTRAMUSCULAR | Status: AC
  Filled 2019-10-08: qty 2

## 2019-10-08 MED ORDER — SIMETHICONE 80 MG PO CHEW
80.0000 mg | CHEWABLE_TABLET | Freq: Three times a day (TID) | ORAL | Status: DC
  Administered 2019-10-09 – 2019-10-11 (×5): 80 mg via ORAL
  Filled 2019-10-08 (×8): qty 1

## 2019-10-08 MED ORDER — LACTATED RINGERS IV SOLN: 500.0000 mL | Freq: Once | INTRAVENOUS | Status: DC

## 2019-10-08 MED ORDER — COCONUT OIL OIL: 1.0000 "application " | TOPICAL_OIL | Status: DC | PRN

## 2019-10-08 MED ORDER — BUTORPHANOL TARTRATE 1 MG/ML IJ SOLN: 2.0000 mg | INTRAMUSCULAR | Status: DC | PRN

## 2019-10-08 MED ORDER — MIDAZOLAM HCL 2 MG/2ML IJ SOLN
INTRAMUSCULAR | Status: AC
  Filled 2019-10-08: qty 2

## 2019-10-08 MED ORDER — MORPHINE SULFATE (PF) 0.5 MG/ML IJ SOLN
INTRAMUSCULAR | Status: AC
  Filled 2019-10-08: qty 10

## 2019-10-08 MED ORDER — MISOPROSTOL 25 MCG QUARTER TABLET
ORAL_TABLET | ORAL | Status: AC
  Filled 2019-10-08: qty 1

## 2019-10-08 MED ORDER — KETOROLAC TROMETHAMINE 30 MG/ML IJ SOLN
30.0000 mg | Freq: Four times a day (QID) | INTRAMUSCULAR | Status: DC
  Administered 2019-10-09 (×2): 30 mg via INTRAVENOUS
  Filled 2019-10-08 (×3): qty 1

## 2019-10-08 MED ORDER — PRENATAL MULTIVITAMIN CH
1.0000 | ORAL_TABLET | Freq: Every day | ORAL | Status: DC
  Administered 2019-10-09 – 2019-10-11 (×2): 1 via ORAL
  Filled 2019-10-08 (×3): qty 1

## 2019-10-08 MED ORDER — SUCCINYLCHOLINE CHLORIDE 20 MG/ML IJ SOLN
INTRAMUSCULAR | Status: DC | PRN
  Administered 2019-10-08: 140 mg via INTRAVENOUS

## 2019-10-08 MED ORDER — KETOROLAC TROMETHAMINE 30 MG/ML IJ SOLN: 30.0000 mg | Freq: Four times a day (QID) | INTRAMUSCULAR | Status: AC | PRN

## 2019-10-08 MED ORDER — KETOROLAC TROMETHAMINE 30 MG/ML IJ SOLN
30.0000 mg | Freq: Four times a day (QID) | INTRAMUSCULAR | Status: AC | PRN
  Administered 2019-10-08: 30 mg via INTRAMUSCULAR

## 2019-10-08 SURGICAL SUPPLY — 29 items
BENZOIN TINCTURE PRP APPL 2/3 (GAUZE/BANDAGES/DRESSINGS) ×3 IMPLANT
CHLORAPREP W/TINT 26ML (MISCELLANEOUS) ×3 IMPLANT
CLOSURE WOUND 1/2 X4 (GAUZE/BANDAGES/DRESSINGS) ×1
CLOTH BEACON ORANGE TIMEOUT ST (SAFETY) ×3 IMPLANT
DRSG OPSITE POSTOP 4X10 (GAUZE/BANDAGES/DRESSINGS) ×3 IMPLANT
ELECT REM PT RETURN 9FT ADLT (ELECTROSURGICAL) ×3
ELECTRODE REM PT RTRN 9FT ADLT (ELECTROSURGICAL) ×1 IMPLANT
GLOVE BIOGEL PI IND STRL 7.0 (GLOVE) ×3 IMPLANT
GLOVE BIOGEL PI INDICATOR 7.0 (GLOVE) ×6
GLOVE ECLIPSE 6.5 STRL STRAW (GLOVE) ×3 IMPLANT
GOWN STRL REUS W/ TWL LRG LVL3 (GOWN DISPOSABLE) ×2 IMPLANT
GOWN STRL REUS W/TWL LRG LVL3 (GOWN DISPOSABLE) ×4
NS IRRIG 1000ML POUR BTL (IV SOLUTION) ×3 IMPLANT
PAD ABD 8X7 1/2 STERILE (GAUZE/BANDAGES/DRESSINGS) ×3 IMPLANT
PAD OB MATERNITY 4.3X12.25 (PERSONAL CARE ITEMS) ×3 IMPLANT
PAD PREP 24X48 CUFFED NSTRL (MISCELLANEOUS) ×3 IMPLANT
RETRACTOR WND ALEXIS 25 LRG (MISCELLANEOUS) IMPLANT
RTRCTR WOUND ALEXIS 25CM LRG (MISCELLANEOUS)
SPONGE GAUZE 4X4 12PLY STER LF (GAUZE/BANDAGES/DRESSINGS) ×6 IMPLANT
STRIP CLOSURE SKIN 1/2X4 (GAUZE/BANDAGES/DRESSINGS) ×2 IMPLANT
SUT PLAIN 2 0 XLH (SUTURE) ×3 IMPLANT
SUT VIC AB 0 CT1 36 (SUTURE) ×6 IMPLANT
SUT VIC AB 2-0 CT1 27 (SUTURE) ×2
SUT VIC AB 2-0 CT1 TAPERPNT 27 (SUTURE) ×1 IMPLANT
SUT VIC AB 4-0 KS 27 (SUTURE) ×3 IMPLANT
TAPE CLOTH SURG 4X10 WHT LF (GAUZE/BANDAGES/DRESSINGS) ×3 IMPLANT
TOWEL OR 17X24 6PK STRL BLUE (TOWEL DISPOSABLE) ×9 IMPLANT
TRAY FOLEY CATH SILVER 16FR (SET/KITS/TRAYS/PACK) ×3 IMPLANT
WATER STERILE IRR 1000ML POUR (IV SOLUTION) ×3 IMPLANT

## 2019-10-08 NOTE — Op Note (Signed)
Krista Carr PROCEDURE DATE: 10/08/2019  PREOPERATIVE DIAGNOSES: Intrauterine pregnancy at [redacted]w[redacted]d weeks gestation; cord prolapse, gestational hypertension  POSTOPERATIVE DIAGNOSES: The same  PROCEDURE: Primary Low Transverse Cesarean Section  SURGEON:  Dr. Lyndel Safe  ASSISTANT:  Dr. Merian Capron  ANESTHESIOLOGIST: Dr. Sampson Goon  INDICATIONS: Krista Carr is a 39 y.o. G1P1001 at [redacted]w[redacted]d taken for cesarean section secondary to cord prolapse. Patient recommended for stat cesarean to which she verbally consented. Confirmed plan of care with partner as well prior to start of procedure.   FINDINGS:  Viable female infant in cephalic presentation.  Apgars 9 and 9, weight 3070 grams.  Clear amniotic fluid.  Intact placenta, three vessel cord.  Normal uterus, fallopian tubes and ovaries bilaterally.  ANESTHESIA: General INTRAVENOUS FLUIDS: 2300 ml ESTIMATED BLOOD LOSS: 555 ml URINE OUTPUT:  500 ml SPECIMENS: Placenta sent to L&D COMPLICATIONS: None immediate  PROCEDURE IN DETAIL:  The patient preoperatively received intravenous antibiotics and had sequential compression devices applied to her lower extremities.   She was then taken to the operating room where an attempt was made to dose the epidural anesthesia up to surgical level. Unfortunately she did not achieve a level quickly enough and had to undergo general anesthesia. She was then placed in a dorsal supine position with a leftward tilt, and prepped and draped in a sterile manner.  A foley catheter had previously been placed into her bladder and attached to constant gravity.  After an adequate timeout was performed, a Pfannenstiel skin incision was made with scalpel and carried through to the underlying layer of fascia. The fascia was incised in the midline, and this incision was extended bilaterally bluntly.  The superior aspect of the fascial incision and the underlying rectus muscles were dissected off bluntly. A similar process was  carried out on the inferior aspect of the fascial incision. The rectus muscles were separated in the midline bluntly and the peritoneum was entered bluntly. Attention was turned to the lower uterine segment where a low transverse hysterotomy was made with a scalpel and extended bilaterally bluntly. The infant was successfully delivered, the cord was clamped and cut and the infant was handed over to awaiting neonatology team. Uterine massage was then administered, and the placenta delivered intact with a three-vessel cord. The uterus was then cleared of clot and debris.  The hysterotomy was closed with 0 Vicryl in a running locked fashion, and an imbricating layer was also placed with 0 Monocryl. The pelvis was cleared of all clot and debris. Hemostasis was confirmed on all surfaces.  The peritoneum was reapproximated using 2-0 Vicryl stitches. The fascia was then closed using 0 Vicryl in a running fashion.  The subcutaneous layer was irrigated. The skin was closed with a 4-0 Vicryl subcuticular stitch. The patient tolerated the procedure well. Sponge, lap, instrument and needle counts were correct x 2.  She was taken to the recovery room in stable condition.   Venora Maples, MD, MPH OB Fellow Faculty Practice- Center for [facility]

## 2019-10-08 NOTE — Progress Notes (Signed)
LABOR PROGRESS NOTE  Krista Carr is a 39 y.o. G1P0 at [redacted]w[redacted]d  admitted for IOL in setting of GHTN.  Subjective: Patient is uncomfortable due to contractions.  Objective: BP (!) 146/94   Pulse 82   Temp 98.5 F (36.9 C) (Oral)   Resp 17   Ht 5\' 4"  (1.626 m)   Wt 94.1 kg   LMP 01/19/2019   SpO2 98%   BMI 35.60 kg/m  or  Vitals:   10/08/19 0918 10/08/19 1113 10/08/19 1114 10/08/19 1117  BP: (!) 146/88  135/87 (!) 146/94  Pulse: 84  78 82  Resp:      Temp:      TempSrc:      SpO2:  99% 98%   Weight:      Height:       Dilation: 5 Effacement (%): 70 Cervical Position: Posterior Station: -3 Presentation: Vertex Exam by:: [email or web address] CNM   FHT: baseline rate 125, moderate varibility, +acel, no decels Toco: every 2-3 minutes  Labs: Lab Results  Component Value Date   WBC 17.1 (H) 10/08/2019   HGB 10.6 (L) 10/08/2019   HCT 32.6 (L) 10/08/2019   MCV 87.9 10/08/2019   PLT 297 10/08/2019    Patient Active Problem List   Diagnosis Date Noted  . Gestational hypertension 10/07/2019  . Gestational (pregnancy-induced) hypertension without significant proteinuria, third trimester 09/26/2019  . Rh negative state in antepartum period 08/25/2019  . Hypothyroidism affecting pregnancy, antepartum 08/11/2019  . High-risk pregnancy, multigravida of advanced maternal age, antepartum 06/26/2019  . Abnormal chromosomal and genetic finding on antenatal screening mother 06/26/2019    Assessment / Plan: 73 y.o. G1P0 at [redacted]w[redacted]d here for IOL due to [facility].  Labor: Labor progressing as anticipated, continue to hold Cytotec Fetal Wellbeing:  Category I  Pain Control:  Labor support without medications, patient desires epidural at this time GBS: negative  Anticipated MOD:  Vaginal delivery    [facility] MS3 10/08/2019, 11:38 AM

## 2019-10-08 NOTE — Progress Notes (Signed)
Patient ID: Taegen Lennox, female   DOB: 02-25-1981, 39 y.o.   MRN: [id number]  Kiondra Caicedo is a 39 y.o. G1P0 at [redacted]w[redacted]d admitted for IOL for gHTN. Subjective: Got some fentanyl and more comfortable.  Able to get some rest.  Still feeling some contractions.    Objective: BP (!) 142/95   Pulse 89   Temp 98.8 F (37.1 C) (Oral)   Resp 17   Ht 5\' 4"  (1.626 m)   Wt 94.1 kg   LMP 01/19/2019   BMI 35.60 kg/m  No intake/output data recorded.  FHT:  FHR: 145 bpm, variability: moderate,  accelerations:  Present,  decelerations:  Absent UC:   regular, every 2-3 minutes  SVE:   Dilation: 4.5 Effacement (%): 70 Station: -2 Exam by:: [email or web address] RN   Labs: Lab Results  Component Value Date   WBC 9.1 10/07/2019   HGB 10.3 (L) 10/07/2019   HCT 31.1 (L) 10/07/2019   MCV 86.1 10/07/2019   PLT 271 10/07/2019    Assessment / Plan: 39 y.o.G1P0 [redacted]w[redacted]d here for IOL for gHTN.  Labor: Progressing well.  Bishop score noq 6.  Give Cytotec #4  Fetal Wellbeing:  Category I Pain Control:  IV pain meds, epidural upon request Pre-eclampsia: No severe range BP.  Pre-E labs 6/8 with no significant change from prior.  Remains asymptomatic.  Continue to monitor.  I/D:  GBS negative Anticipated MOD:  SVD    Jeralyn Nolden 8/8, MD PGY-2 Resident Family Medicine 10/08/2019, 1:52 AM

## 2019-10-08 NOTE — Discharge Summary (Signed)
Postpartum Discharge Summary    Patient Name: Krista Carr DOB: 09-21-1980 MRN: [id number]  Date of admission: 10/07/2019 Delivery date:10/08/2019  Delivering provider: Caren Macadam  Date of discharge: 10/11/2019  Admitting diagnosis: Gestational hypertension [O13.9] Intrauterine pregnancy: [redacted]w[redacted]d    Secondary diagnosis:  Active Problems:   High-risk pregnancy, multigravida of advanced maternal age, antepartum   Abnormal chromosomal and genetic finding on antenatal screening mother   Hypothyroidism affecting pregnancy, antepartum   Rh negative state in antepartum period   Gestational (pregnancy-induced) hypertension without significant proteinuria, third trimester   Gestational hypertension   Cesarean delivery delivered   Umbilical cord prolapse  Additional problems: n/a    Discharge diagnosis: Term Pregnancy Delivered and Gestational Hypertension                                              Post partum procedures:n/a Augmentation: AROM, Cytotec and IP Foley Complications: Cord Prolapse  Hospital course: Induction of Labor With Cesarean Section   39y.o. yo G1P1001 at 376w3das admitted to the hospital 10/07/2019 for induction of labor. Patient had a labor course significant for GHTN. The patient went for cesarean section due to Cord Prolapse. Delivery details are as follows: Membrane Rupture Time/Date: 1:57 PM ,10/08/2019   Delivery Method:C-Section, Low Transverse  under general anesthesia Details of operation can be found in separate operative Note.  Patient had an uncomplicated postpartum course. She is ambulating, tolerating a regular diet, passing flatus, and urinating well.  Patient is discharged home in stable condition on 10/11/19.      Newborn Data: Birth date:10/08/2019  Birth time:2:12 PM  Gender:Female  Living status:Living  Apgars:9 ,9  Weight:3070 g                                 Magnesium Sulfate received: No BMZ received:  No Rhophylac:N/A MMR:N/A T-DaP: Declined antenatally Flu:  Declined antenatally Transfusion:No  Physical exam  Vitals:   10/10/19 1053 10/10/19 1424 10/10/19 2135 10/11/19 0546  BP: 136/85 122/83 122/83 120/82  Pulse: (!) 101 95 95 67  Resp:  (!) _0 Temp:  97.9 F (36.6 C) 98.3 F (36.8 C) 97.6 F (36.4 C)  TempSrc:  Oral Oral Axillary  SpO2:  100%    Weight:      Height:       General: alert, cooperative and no distress Lochia: appropriate Uterine Fundus: firm Incision: Healing well with no significant drainage DVT Evaluation: No evidence of DVT seen on physical exam. Labs: Lab Results  Component Value Date   WBC 17.3 (H) 10/09/2019   HGB 8.7 (L) 10/09/2019   HCT 26.8 (L) 10/09/2019   MCV 89.0 10/09/2019   PLT 257 10/09/2019   CMP Latest Ref Rng & Units 10/07/2019  Glucose 70 - 99 mg/dL 88  BUN 6 - 20 mg/dL 5(L)  Creatinine 0.44 - 1.00 mg/dL 0.55  Sodium 135 - 145 mmol/L 134(L)  Potassium 3.5 - 5.1 mmol/L 3.7  Chloride 98 - 111 mmol/L 103  CO2 22 - 32 mmol/L 21(L)  Calcium 8.9 - 10.3 mg/dL 8.5(L)  Total Protein 6.5 - 8.1 g/dL 5.8(L)  Total Bilirubin 0.3 - 1.2 mg/dL 0.3  Alkaline Phos 38 - 126 U/L 123  AST 15 - 41 U/L 19  ALT  0 - 44 U/L 13   Edinburgh Score: Edinburgh Postnatal Depression Scale Screening Tool 10/10/2019  I have been able to laugh and see the funny side of things. 0  I have looked forward with enjoyment to things. 0  I have blamed myself unnecessarily when things went wrong. 1  I have been anxious or worried for no good reason. 0  I have felt scared or panicky for no good reason. 0  Things have been getting on top of me. 1  I have been so unhappy that I have had difficulty sleeping. 0  I have felt sad or miserable. 1  I have been so unhappy that I have been crying. 0  The thought of harming myself has occurred to me. 0  Edinburgh Postnatal Depression Scale Total 3     After visit meds:  Allergies as of 10/11/2019   No Known  Allergies     Medication List    STOP taking these medications   Comfort Fit Maternity Supp Lg Misc     TAKE these medications   Butalbital-APAP-Caffeine 50-325-40 MG capsule Take 1-2 capsules by mouth every 6 (six) hours as needed for migraine.   ferrous sulfate 325 (65 FE) MG tablet Take 325 mg by mouth daily with breakfast.   ibuprofen 800 MG tablet Commonly known as: ADVIL Take 1 tablet (800 mg total) by mouth every 8 (eight) hours.   levothyroxine 75 MCG tablet Commonly known as: SYNTHROID Take 1 tablet (75 mcg total) by mouth daily before breakfast.   multivitamin-prenatal 27-0.8 MG Tabs tablet Take 1 tablet by mouth daily at 12 noon.   oxyCODONE-acetaminophen 5-325 MG tablet Commonly known as: Percocet Take 1 tablet by mouth every 4 (four) hours as needed for severe pain.        Discharge home in stable condition Infant Feeding: Bottle Infant Disposition:home with mother Discharge instruction: per After Visit Summary and Postpartum booklet. Activity: Advance as tolerated. Pelvic rest for 6 weeks.  Diet: routine diet Future Appointments: Future Appointments  Date Time Provider [place]  10/16/2019  1:20 PM [facility] None  11/06/2019  1:00 PM [place] Mode, MD [facility] None   Follow up Visit:  [facility] Follow up.   Why: For an incision check and postpartum appointment Contact information: [address] [place] [address] [phone]               Please schedule this patient for a Virtual postpartum visit in 4 weeks with the following provider: Any provider. Additional Postpartum F/U:Incision check and BP check 1 week  High risk pregnancy complicated MV:HQIO Delivery mode: STAT C-Section, Low Transverse  for cord prolapse Anticipated Birth Control:  Unsure   10/11/2019 Wende Mott, CNM

## 2019-10-08 NOTE — Progress Notes (Signed)
Patient ID: Krista Carr, female   DOB: 1980-08-19, 39 y.o.   MRN: [id number]  Krista Carr is a 39 y.o. G1P0 at [redacted]w[redacted]d admitted for IOL for gHTN.  Subjective: Feeling contractions, tolerating well with IV pain meds.    Objective: BP 140/85   Pulse 87   Temp 98.3 F (36.8 C) (Oral)   Resp 18   Ht 5\' 4"  (1.626 m)   Wt 94.1 kg   LMP 01/19/2019   BMI 35.60 kg/m  No intake/output data recorded.  FHT:  FHR: 125 bpm, variability: moderate,  accelerations:  Present,  decelerations:  Absent UC:   regular, every 2-3 minutes  SVE:   Dilation: 4.5 Effacement (%): 70 Station: -3 Exam by:: Dr. [email or web address]  Labs: Lab Results  Component Value Date   WBC 16.9 (H) 10/08/2019   HGB 10.3 (L) 10/08/2019   HCT 31.0 (L) 10/08/2019   MCV 87.1 10/08/2019   PLT 266 10/08/2019    Assessment / Plan: 39 y.o.G1P0 [redacted]w[redacted]d here for IOL for gHTN.  Labor:Bishop score 6, give Cytotec #5. Fetal Wellbeing:Category I Pain Control:IV pain meds, epidural upon request Pre-eclampsia:No severe range BP. Pre-E labs 6/8 with no significant change from prior. Remains asymptomatic. Continue to monitor.  I/D:GBS negative Anticipated MOD:SVD   Krista Carr 8/8, MD PGY-2 Resident Family Medicine 10/08/2019, 6:08 AM

## 2019-10-08 NOTE — Anesthesia Procedure Notes (Signed)
Epidural Patient location during procedure: OB Start time: 10/08/2019 11:06 AM End time: 10/08/2019 11:13 AM  Staffing Anesthesiologist: Marcene Duos, MD Performed: anesthesiologist   Preanesthetic Checklist Completed: patient identified, IV checked, site marked, risks and benefits discussed, surgical consent, monitors and equipment checked, pre-op evaluation and timeout performed  Epidural Patient position: sitting Prep: DuraPrep and site prepped and draped Patient monitoring: continuous pulse ox and blood pressure Approach: midline Location: L4-L5 Injection technique: LOR air  Needle:  Needle type: Tuohy  Needle gauge: 17 G Needle length: 9 cm and 9 Needle insertion depth: 6 cm Catheter type: closed end flexible Catheter size: 19 Gauge Catheter at skin depth: 11 cm Test dose: negative  Assessment Events: blood not aspirated, injection not painful, no injection resistance, no paresthesia and negative IV test

## 2019-10-08 NOTE — Anesthesia Procedure Notes (Signed)
Procedure Name: Intubation Performed by: Marcene Duos, MD Pre-anesthesia Checklist: Patient identified, Emergency Drugs available, Suction available and Patient being monitored Patient Re-evaluated:Patient Re-evaluated prior to induction Oxygen Delivery Method: Circle system utilized Preoxygenation: Pre-oxygenation with 100% oxygen Induction Type: IV induction, Cricoid Pressure applied and Rapid sequence Laryngoscope Size: Glidescope and 4 Grade View: Grade I Tube type: Oral Tube size: 7.5 mm Number of attempts: 1 Airway Equipment and Method: Stylet,  Oral airway,  Video-laryngoscopy and Rigid stylet Placement Confirmation: ETT inserted through vocal cords under direct vision,  positive ETCO2 and breath sounds checked- equal and bilateral Secured at: 21 cm Tube secured with: Tape Dental Injury: Teeth and Oropharynx as per pre-operative assessment

## 2019-10-08 NOTE — Lactation Note (Signed)
This note was copied from a baby's chart. Lactation Consultation Note  Patient Name: Krista Carr Date: 10/08/2019   [name] Krista Carr now 3 hours old born at 37 weeks and 3 days gestation via emergency csection.  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  Mom with hypothyroidism.  Mom reports positive breast changes.  Mom reports they did not take a breastfeeding class or have any breastfeeding information.  Mom reports they plan to give breastfeeding a try.  Infant not STS but laying on mom.  Asked mom if I could put him STS.  She agreed.  Reviewed benefits of STS.  Mom reports he has not fed since birth.  Asked mom if anyone had shown her hand expression.  Mom reports the nurse did it and got a drop.  Not able to express anything on the right.  Able to express easily from the left.  Showed parents how to hand express and finger feed colostrum.  Infant started sucking on my finger and started rooting. Took approxiamtely 4 ml from LC gloved finger and moms finger.   Moved him over to the left breast and he was a little fussy but latched well for about 5 minutes with rythmic sucking and a few swallows seen but not heard.  Mom many benefit from hand pump to help evert nipples.  Mom has short shaft nipples but infant was able to latch easily once nipples  everted some manually.  He fell asleep and LC broke suction and took him off.  Left STS with mom. Reviewed Understanding mother and baby.  Urged to feed on cue 8-12 or more times day  and call lactation as needed.    Maternal Data    Feeding    LATCH Score                   Interventions    Lactation Tools Discussed/Used     Consult Status      Krista Carr 10/08/2019, 6:08 PM

## 2019-10-08 NOTE — Anesthesia Postprocedure Evaluation (Signed)
Anesthesia Post Note  Patient: Krista Carr  Procedure(s) Performed: CESAREAN SECTION (Abdomen)     Patient location during evaluation: PACU Anesthesia Type: General Level of consciousness: awake and alert Pain management: pain level controlled Vital Signs Assessment: post-procedure vital signs reviewed and stable Respiratory status: spontaneous breathing, nonlabored ventilation, respiratory function stable and patient connected to nasal cannula oxygen Cardiovascular status: blood pressure returned to baseline and stable Postop Assessment: no apparent nausea or vomiting Anesthetic complications: no    Last Vitals:  Vitals:   10/08/19 1645 10/08/19 1654  BP: 117/81 122/78  Pulse: 77 82  Resp: 10 18  Temp:  37.2 C  SpO2: 94% 95%    Last Pain:  Vitals:   10/08/19 1700  TempSrc:   PainSc: 0-No pain   Pain Goal:    LLE Motor Response: Purposeful movement (10/08/19 1645) LLE Sensation: Numbness, Tingling (10/08/19 1645) RLE Motor Response: Purposeful movement (10/08/19 1645) RLE Sensation: Numbness, Tingling (10/08/19 1645)     Epidural/Spinal Function Cutaneous sensation: Able to Wiggle Toes (10/08/19 1700), Patient able to flex knees: Yes (10/08/19 1700), Patient able to lift hips off bed: Yes (10/08/19 1700), Back pain beyond tenderness at insertion site: No (10/08/19 1700), Progressively worsening motor and/or sensory loss: No (10/08/19 1700), Bowel and/or bladder incontinence post epidural: No (10/08/19 1700)  Kennieth Rad

## 2019-10-08 NOTE — Transfer of Care (Signed)
Immediate Anesthesia Transfer of Care Note  Patient: Krista Carr  Procedure(s) Performed: CESAREAN SECTION (Abdomen)  Patient Location: PACU  Anesthesia Type:General and Epidural  Level of Consciousness: awake and patient cooperative  Airway & Oxygen Therapy: Patient Spontanous Breathing and Patient connected to nasal cannula oxygen  Post-op Assessment: Report given to RN and Post -op Vital signs reviewed and stable  Post vital signs: Reviewed and stable  Last Vitals:  Vitals Value Taken Time  BP 96/65 10/08/19 1507  Temp    Pulse 79 10/08/19 1511  Resp 18 10/08/19 1511  SpO2 98 % 10/08/19 1511  Vitals shown include unvalidated device data.  Last Pain:  Vitals:   10/08/19 0749  TempSrc: Oral  PainSc:          Complications: No apparent anesthesia complications

## 2019-10-08 NOTE — Progress Notes (Signed)
Patient ID: Krista Carr, female   DOB: Sep 26, 1980, 39 y.o.   MRN: [id number] Tracie Dore is a 39 y.o. G1P0 at [redacted]w[redacted]d.  Subjective: Comfortable w/ epidural.   Objective: BP (!) 138/92   Pulse 81   Temp 98.5 F (36.9 C) (Oral)   Resp 17   Ht 5\' 4"  (1.626 m)   Wt 94.1 kg   LMP 01/19/2019   SpO2 98%   BMI 35.60 kg/m    FHT:  FHR: 120 bpm, variability: mod,  accelerations:  15x15,  decelerations:  none UC:   Q 3-6 minutes, mod Dilation: 5 Effacement (%): 90 Cervical Position: Mid Station: -3, well-applied Presentation: Vertex Exam by:: [email or web address], CNM SROM moderate amount of clear fluid. Cord palpated. Head elevated off of cord and Code Cesarean called.  Labs: NA  Assessment / Plan: [redacted]w[redacted]d week IUP Labor: Early Fetal Wellbeing:  Category I Pain Control:  Epidral Anticipated MOD:  STAT C/S for cord prolapse.     [redacted]w[redacted]d, Katrinka Blazing, CNM 10/08/2019 2:21 PM

## 2019-10-08 NOTE — Anesthesia Preprocedure Evaluation (Signed)
Anesthesia Evaluation  Patient identified by MRN, date of birth, ID band Patient awake    Reviewed: Allergy & Precautions, Patient's Chart, lab work & pertinent test results  Airway Mallampati: II  TM Distance: >3 FB     Dental   Pulmonary former smoker,    breath sounds clear to auscultation       Cardiovascular hypertension (PIH),  Rhythm:Regular Rate:Normal     Neuro/Psych negative neurological ROS     GI/Hepatic negative GI ROS, Neg liver ROS,   Endo/Other  Hypothyroidism   Renal/GU negative Renal ROS     Musculoskeletal   Abdominal   Peds  Hematology  (+) anemia ,   Anesthesia Other Findings   Reproductive/Obstetrics (+) Pregnancy                             Anesthesia Physical Anesthesia Plan  ASA: III  Anesthesia Plan: Epidural   Post-op Pain Management:    Induction:   PONV Risk Score and Plan: Treatment may vary due to age or medical condition  Airway Management Planned: Natural Airway  Additional Equipment:   Intra-op Plan:   Post-operative Plan:   Informed Consent: I have reviewed the patients History and Physical, chart, labs and discussed the procedure including the risks, benefits and alternatives for the proposed anesthesia with the patient or authorized representative who has indicated his/her understanding and acceptance.       Plan Discussed with:   Anesthesia Plan Comments:         Anesthesia Quick Evaluation

## 2019-10-09 ENCOUNTER — Encounter (HOSPITAL_COMMUNITY): Payer: Self-pay | Admitting: Family Medicine

## 2019-10-09 LAB — CBC
HCT: 26.8 % — ABNORMAL LOW (ref 36.0–46.0)
Hemoglobin: 8.7 g/dL — ABNORMAL LOW (ref 12.0–15.0)
MCH: 28.9 pg (ref 26.0–34.0)
MCHC: 32.5 g/dL (ref 30.0–36.0)
MCV: 89 fL (ref 80.0–100.0)
Platelets: 257 10*3/uL (ref 150–400)
RBC: 3.01 MIL/uL — ABNORMAL LOW (ref 3.87–5.11)
RDW: 12.6 % (ref 11.5–15.5)
WBC: 17.3 10*3/uL — ABNORMAL HIGH (ref 4.0–10.5)
nRBC: 0 % (ref 0.0–0.2)

## 2019-10-09 MED ORDER — IBUPROFEN 800 MG PO TABS
800.0000 mg | ORAL_TABLET | Freq: Three times a day (TID) | ORAL | Status: DC
  Administered 2019-10-09 – 2019-10-11 (×6): 800 mg via ORAL
  Filled 2019-10-09 (×5): qty 1

## 2019-10-09 MED ORDER — SODIUM CHLORIDE 0.9 % IV SOLN
510.0000 mg | Freq: Once | INTRAVENOUS | Status: AC
  Administered 2019-10-09: 510 mg via INTRAVENOUS
  Filled 2019-10-09: qty 17

## 2019-10-09 NOTE — Lactation Note (Signed)
This note was copied from a baby's chart. Lactation Consultation Note  Patient Name: Krista Carr HENID'P Date: 10/09/2019 Reason for consult: Follow-up assessment;Early term 37-38.6wks Type of Endocrine Disorder?: Thyroid   Mom holding infant swaddled in blankets.  She states he is not waking to feed.  LC suggested STS and hand express.  Mom attempted to latch after infant cueing but was unsuccessful.  LC did suck assessment.  Infant could not get into a rhythmic suck pattern but would chomp then tongue thrust.  Mom states she is able to hand express but is sore due to infant coming on and off breast repeatedly.    [facility] taught family proper positioning with good head and neck support and encouraged mom bringing infant to breast.  Infant had repeated attempts but after a few sucks would tongue thrust nipple.  Suck training taught.    LC hand mom return demo of applying NS.  She applied NS #20.  Infant did latch well to shield and did sustain latch for 12 minutes. No visible NS and good jaw movements noted during feed.  No audible swallows.   Mom used massage and compression to stimulate infant and keep him awake.  No colostrum seen in NS after feeding.    LC set up DEBP and reviewed set up, cleaning, parts, usage, and storage.    LC discussed ETI behavior and stressed to parents importance of hand expressing and pumping in order to collect EBM while infant is working on feeds.  Parents understand if she wants to provide breastmilk for infant she must stimulate her supply by latching him and pumping.    Plan:   Mom pumps after BF.  Hand exp. After pumping and feed back EBM to infant.  STS and feed with cues and keep infant awake during feedings.  Look for colostrum in shield.  If infant is not latching, call out for assistance.      Maternal Data Has patient been taught Hand Expression?: Yes Does the patient have breastfeeding experience prior to this delivery?: No  Feeding Feeding Type:  Breast Fed  LATCH Score Latch: Repeated attempts needed to sustain latch, nipple held in mouth throughout feeding, stimulation needed to elicit sucking reflex.  Audible Swallowing: None  Type of Nipple: Everted at rest and after stimulation (short shaft)  Comfort (Breast/Nipple): Filling, red/small blisters or bruises, mild/mod discomfort  Hold (Positioning): Assistance needed to correctly position infant at breast and maintain latch.  LATCH Score: 5  Interventions Interventions: Breast feeding basics reviewed;Assisted with latch;Skin to skin;Breast massage;Hand express;Position options;Support pillows;DEBP;Adjust position;Breast compression  Lactation Tools Discussed/Used Tools: Pump Pump Review: Setup, frequency, and cleaning;Milk Storage Initiated by:: Threasa Alpha Date initiated:: 10/10/19   Consult Status Consult Status: Follow-up Date: 10/10/19 Follow-up type: In-patient    Krista Carr [facility] - Inpatient 10/09/2019, 11:18 PM

## 2019-10-09 NOTE — Progress Notes (Addendum)
Post operative Day 1 Subjective: no complaints, up ad lib, voiding, tolerating PO and + flatus  Objective: Blood pressure 105/78, pulse 81, temperature 97.6 F (36.4 C), temperature source Oral, resp. rate 20, height 5\' 4"  (1.626 m), weight 94.1 kg, last menstrual period 01/19/2019, SpO2 100 %, unknown if currently breastfeeding.  Physical Exam:  General: alert, cooperative and no distress Lochia: appropriate Uterine Fundus: firm Incision: dressing clean/dry/intact DVT Evaluation: No evidence of DVT seen on physical exam.  Recent Labs    10/08/19 1532 10/09/19 0701  HGB 8.5* 8.7*  HCT 26.2* 26.8*    Assessment/Plan: Plan for discharge tomorrow and Breastfeeding  Hemoglobin 8.7, IV feraheme ordered   LOS: 2 days   EMILY 12/09/19, MD PGY-2 Resident Family Medicine 10/09/2019, 7:54 AM  GME ATTESTATION:  I saw and evaluated the patient. I agree with the findings and the plan of care as documented in the resident's note with addition of the following: -IV feraheme ordered for Hgb 8.7>8.5 -DC home either POD#2 or POD#3  12/09/2019, DO OB Fellow, Faculty Practice [facility], Center for [facility] 10/09/2019 8:00 AM

## 2019-10-10 MED ORDER — BUTALBITAL-APAP-CAFFEINE 50-325-40 MG PO TABS
1.0000 | ORAL_TABLET | Freq: Four times a day (QID) | ORAL | Status: DC | PRN
  Administered 2019-10-10 – 2019-10-11 (×2): 1 via ORAL
  Filled 2019-10-10 (×2): qty 1

## 2019-10-10 MED ORDER — KETOROLAC TROMETHAMINE 30 MG/ML IJ SOLN
30.0000 mg | Freq: Once | INTRAMUSCULAR | Status: AC
  Administered 2019-10-10: 30 mg via INTRAMUSCULAR
  Filled 2019-10-10: qty 1

## 2019-10-10 MED ORDER — METOCLOPRAMIDE HCL 10 MG PO TABS
10.0000 mg | ORAL_TABLET | Freq: Once | ORAL | Status: AC
  Administered 2019-10-10: 10 mg via ORAL
  Filled 2019-10-10: qty 1

## 2019-10-10 NOTE — Progress Notes (Addendum)
Received call for patient's persistent HA rated as 6/10 in severity. Reported to bedside in order to evaluate patient who confirmed the HA. Stated that it it was right sided in location and progressively worsening. Denies nausea or emesis, denies vision changes or feelings of weakness/numbness or tingling. Patient reports some photosensitivity and mild sensitivity to loud noises. She also adds that she has a hx of migraines that are normally relieved by ibuprofen so she is surprised that this HA does not respond to her normal medication. Denies recent head trauma. Patient has been able to tolerate PO diet and states she is agreeable to trying a headache cocktail.   PE  Vitals with BMI 10/10/2019 10/10/2019 10/10/2019  Height - - -  Weight - - -  BMI - - -  Systolic 122 136 517  Diastolic 83 85 81  Pulse 95 101 96    Gen: female sitting up in bed holding infant, appears to be in NAD  HEENT: atraumatic, no conjunctival injection, no rhinorrhea Neuro: demonstrates 5/5 strength in bilateral upper and lower extremities, no decreased sensation to touch,   A/P  Likely tension HA, could also consider HA secondary to sleep deprivation. Patient well hydrated and PIV has been removed so will not order IV fluids at this time.  -encouraged patient to continue drinking fluids  -last dose of ibuprofen was 0739, so will wait until 1639 for 30mg  IM toradol  -ordered 10mg  reglan    , MD  PGY-1, [facility]

## 2019-10-10 NOTE — Progress Notes (Addendum)
Post Partum Day 2 Subjective: up ad lib, voiding, tolerating PO and + flatus  Has a headache, no visual disturbances.    Objective: Blood pressure 114/78, pulse 78, temperature 97.9 F (36.6 C), temperature source Oral, resp. rate 18, height 5' 4"  (1.626 m), weight 94.1 kg, last menstrual period 01/19/2019, SpO2 96 %, unknown if currently breastfeeding.  Physical Exam:  General: alert, cooperative and appears stated age Lochia: appropriate Uterine Fundus: firm Incision: Small area of dried blood on dressing without active bleeding DVT Evaluation: No evidence of DVT seen on physical exam.  Recent Labs    10/08/19 1532 10/09/19 0701  HGB 8.5* 8.7*  HCT 26.2* 26.8*    Assessment/Plan: Plan for discharge tomorrow and Breastfeeding   Headache:  BP has been excellent on Procardia.  If trends in the 100's/60's, will stop.   Start Fioricet PRN.   LOS: 3 days   EMILY Madelin Headings, MD PGY-2 Resident Family Medicine 10/10/2019, 7:53 AM  I personally saw and evaluated the patient, performing the key elements of the service. I developed and verified the management plan that is described in the resident's/student's note, and I agree with the content with my edits above. VSS, HRR&R, Resp unlabored, Legs neg.  Nigel Berthold, CNM 10/10/2019 8:19 AM

## 2019-10-10 NOTE — Lactation Note (Signed)
This note was copied from a baby's chart. Lactation Consultation Note  Patient Name: Krista Carr TXMIW'O Date: 10/10/2019 Reason for consult: Follow-up assessment;Other (Comment) (MBURN Cecilie Lowers reported to this [facility] mom strictly changed to formula - see her note)   Maternal Data    Feeding    LATCH Score                   Interventions    Lactation Tools Discussed/Used     Consult Status Consult Status: Complete Date: 10/10/19    Kathrin Greathouse 10/10/2019, 3:18 PM

## 2019-10-10 NOTE — Progress Notes (Signed)
Was called to evaluate patient for possible post dural puncture headache.  No indication of dural puncture in record.  Headache not classic for PDPH.  Advised patient about possibility of epidural blood patch but also advised that the procedure would be ineffective for her headache if headache not related to dural puncture.  Patient understood and was advised that she could contact us if she would like [place] to reevaluate in the future.

## 2019-10-11 MED ORDER — OXYCODONE-ACETAMINOPHEN 5-325 MG PO TABS: 1.0000 | ORAL_TABLET | ORAL | 0 refills | Status: DC | PRN

## 2019-10-11 MED ORDER — IBUPROFEN 800 MG PO TABS: 800.0000 mg | ORAL_TABLET | Freq: Three times a day (TID) | ORAL | 0 refills | Status: AC

## 2019-10-11 NOTE — Discharge Instructions (Signed)

## 2019-10-16 ENCOUNTER — Ambulatory Visit (INDEPENDENT_AMBULATORY_CARE_PROVIDER_SITE_OTHER): Payer: Medicaid Other

## 2019-10-16 ENCOUNTER — Other Ambulatory Visit: Payer: Self-pay

## 2019-10-16 VITALS — BP 129/84 | HR 85 | Ht 64.0 in | Wt 183.9 lb

## 2019-10-16 DIAGNOSIS — O135 Gestational [pregnancy-induced] hypertension without significant proteinuria, complicating the puerperium: Secondary | ICD-10-CM

## 2019-10-16 DIAGNOSIS — O133 Gestational [pregnancy-induced] hypertension without significant proteinuria, third trimester: Secondary | ICD-10-CM

## 2019-10-16 NOTE — Progress Notes (Addendum)
Subjective:     Krista Carr is a 39 y.o. female who presents to the clinic 1 weeks status post LTCS for Incision check and BP check. Eating a regular diet without difficulty. Bowel movements are normal. The patient is not having any pain.   Review of Systems Pertinent items are noted in HPI.    Objective:    BP 129/84   Pulse 85   Ht 5\' 4"  (1.626 m)   Wt 183 lb 14.4 oz (83.4 kg)   LMP 01/19/2019   Breastfeeding No   BMI 31.57 kg/m  General:  alert  Abdomen: soft, bowel sounds active, non-tender  Incision:   healing well, no drainage, no erythema, no hernia, no seroma, no swelling, no dehiscence, incision well approximated     Assessment:    Doing well postoperatively.  BP is controlled   Plan:     1. Continue any current medications. 2. Wound care discussed. 3. Continue checking BP and call 01/21/2019 if BP       is elevated 4. Activity restrictions: none 5. Anticipated return to work: not applicable. 6. Follow up: 3 weeks for PP visit per              [place], NP .    Donia Ast, RMA       Patient was assessed and managed by nursing staff during this encounter. I have reviewed the chart and agree with the documentation and plan. I have also made any necessary editorial changes.  Maretta Bees, NP 10/16/2019 2:19 PM

## 2019-11-06 ENCOUNTER — Encounter: Payer: Self-pay | Admitting: Obstetrics & Gynecology

## 2019-11-06 ENCOUNTER — Other Ambulatory Visit: Payer: Self-pay

## 2019-11-06 ENCOUNTER — Ambulatory Visit (HOSPITAL_BASED_OUTPATIENT_CLINIC_OR_DEPARTMENT_OTHER): Payer: Medicaid Other | Admitting: Obstetrics & Gynecology

## 2019-11-06 VITALS — BP 128/84 | HR 71 | Wt 187.0 lb

## 2019-11-06 DIAGNOSIS — Z98891 History of uterine scar from previous surgery: Secondary | ICD-10-CM

## 2019-11-06 DIAGNOSIS — O99285 Endocrine, nutritional and metabolic diseases complicating the puerperium: Secondary | ICD-10-CM

## 2019-11-06 DIAGNOSIS — E039 Hypothyroidism, unspecified: Secondary | ICD-10-CM

## 2019-11-06 MED ORDER — LEVOTHYROXINE SODIUM 75 MCG PO TABS: 75.0000 ug | ORAL_TABLET | Freq: Every day | ORAL | 3 refills | Status: AC

## 2019-11-06 NOTE — Progress Notes (Signed)
    Post Partum Visit Note  Krista Carr is a 39 y.o. G29P1001 female who presents for a postpartum visit. She is 4 weeks postpartum following a primary cesarean section.  I have fully reviewed the prenatal and intrapartum course. The delivery was at 37 gestational weeks.  Anesthesia: general. Postpartum course has been good. Baby is doing well. Baby is feeding by bottle - [employer] Good Start. Bleeding no bleeding. Bowel function is normal. Bladder function is normal. Patient is not sexually active. Contraception method is none. Postpartum depression screening: negative.(score: 1)  The following portions of the patient's history were reviewed and updated as appropriate: allergies, current medications, past family history, past medical history, past social history, past surgical history and problem list.  Review of Systems Pertinent items are noted in HPI.    Objective:  Last menstrual period 01/19/2019, not currently breastfeeding.  General:  alert, cooperative and no distress           Abdomen: soft, non-tender; bowel sounds normal; no masses,  no organomegaly and incision healing well   Vulva:  not evaluated  Vagina: not evaluated                    Assessment:    normal postpartum exam. Pap smear not done at today's visit.  Hypothyroidism affecting pregnancy, antepartum - Plan: Ambulatory referral to [facility]  Cesarean delivery delivered  Postpartum care following cesarean delivery Synthroid 75 mcg  Plan:   Essential components of care per ACOG recommendations:  1.  Mood and well being: Patient with negative depression screening today. Reviewed local resources for support.  - Patient does not use tobacco.  - hx of drug use? No   2. Infant care and feeding:  -Patient currently breastmilk feeding? No  -Social determinants of health (SDOH) reviewed in EPIC. No concern*  3. Sexuality, contraception and birth spacing - Patient does not want a pregnancy in the next  year.  Desired family size is 1 children.  - Reviewed forms of contraception in tiered fashion. Patient desired female steriitity today.   - Discussed birth spacing of 18 months  4. Sleep and fatigue -Encouraged family/partner/community support of 4 hrs of uninterrupted sleep to help with mood and fatigue  5. Physical Recovery  - Discussed patients delivery and complications  Patient expressed understanding - Patient has urinary incontinence? No  - Patient is safe to resume physical and sexual activity  6.  Health Maintenance - Last pap smear done (unknown, patient thinks it was in the last year) and was normal with negative HPV. 7.  Hypothyroid Chronic Disease - PCP follow up Adam Phenix, MD Center for [facility], [facility]

## 2019-11-06 NOTE — Patient Instructions (Signed)
Cesarean Delivery, Care After This sheet gives you information about how to care for yourself after your procedure. Your health care provider may also give you more specific instructions. If you have problems or questions, contact your health care provider. What can I expect after the procedure? After the procedure, it is common to have:  A small amount of blood or clear fluid coming from the incision.  Some redness, swelling, and pain in your incision area.  Some abdominal pain and soreness.  Vaginal bleeding (lochia). Even though you did not have a vaginal delivery, you will still have vaginal bleeding and discharge.  Pelvic cramps.  Fatigue. You may have pain, swelling, and discomfort in the tissue between your vagina and your anus (perineum) if:  Your C-section was unplanned, and you were allowed to labor and push.  An incision was made in the area (episiotomy) or the tissue tore during attempted vaginal delivery. Follow these instructions at home: Incision care   Follow instructions from your health care provider about how to take care of your incision. Make sure you: ? Wash your hands with soap and water before you change your bandage (dressing). If soap and water are not available, use hand sanitizer. ? If you have a dressing, change it or remove it as told by your health care provider. ? Leave stitches (sutures), skin staples, skin glue, or adhesive strips in place. These skin closures may need to stay in place for 2 weeks or longer. If adhesive strip edges start to loosen and curl up, you may trim the loose edges. Do not remove adhesive strips completely unless your health care provider tells you to do that.  Check your incision area every day for signs of infection. Check for: ? More redness, swelling, or pain. ? More fluid or blood. ? Warmth. ? Pus or a bad smell.  Do not take baths, swim, or use a hot tub until your health care provider says it's okay. Ask your health  care provider if you can take showers.  When you cough or sneeze, hug a pillow. This helps with pain and decreases the chance of your incision opening up (dehiscing). Do this until your incision heals. Medicines  Take over-the-counter and prescription medicines only as told by your health care provider.  If you were prescribed an antibiotic medicine, take it as told by your health care provider. Do not stop taking the antibiotic even if you start to feel better.  Do not drive or use heavy machinery while taking prescription pain medicine. Lifestyle  Do not drink alcohol. This is especially important if you are breastfeeding or taking pain medicine.  Do not use any products that contain nicotine or tobacco, such as cigarettes, e-cigarettes, and chewing tobacco. If you need help quitting, ask your health care provider. Eating and drinking  Drink at least 8 eight-ounce glasses of water every day unless told not to by your health care provider. If you breastfeed, you may need to drink even more water.  Eat high-fiber foods every day. These foods may help prevent or relieve constipation. High-fiber foods include: ? Whole grain cereals and breads. ? Brown rice. ? Beans. ? Fresh fruits and vegetables. Activity   If possible, have someone help you care for your baby and help with household activities for at least a few days after you leave the hospital.  Return to your normal activities as told by your health care provider. Ask your health care provider what activities are safe for   you.  Rest as much as possible. Try to rest or take a nap while your baby is sleeping.  Do not lift anything that is heavier than 10 lbs (4.5 kg), or the limit that you were told, until your health care provider says that it is safe.  Talk with your health care provider about when you can engage in sexual activity. This may depend on your: ? Risk of infection. ? How fast you heal. ? Comfort and desire to  engage in sexual activity. General instructions  Do not use tampons or douches until your health care provider approves.  Wear loose, comfortable clothing and a supportive and well-fitting bra.  Keep your perineum clean and dry. Wipe from front to back when you use the toilet.  If you pass a blood clot, save it and call your health care provider to discuss. Do not flush blood clots down the toilet before you get instructions from your health care provider.  Keep all follow-up visits for you and your baby as told by your health care provider. This is important. Contact a health care provider if:  You have: ? A fever. ? Bad-smelling vaginal discharge. ? Pus or a bad smell coming from your incision. ? Difficulty or pain when urinating. ? A sudden increase or decrease in the frequency of your bowel movements. ? More redness, swelling, or pain around your incision. ? More fluid or blood coming from your incision. ? A rash. ? Nausea. ? Little or no interest in activities you used to enjoy. ? Questions about caring for yourself or your baby.  Your incision feels warm to the touch.  Your breasts turn red or become painful or hard.  You feel unusually sad or worried.  You vomit.  You pass a blood clot from your vagina.  You urinate more than usual.  You are dizzy or light-headed. Get help right away if:  You have: ? Pain that does not go away or get better with medicine. ? Chest pain. ? Difficulty breathing. ? Blurred vision or spots in your vision. ? Thoughts about hurting yourself or your baby. ? New pain in your abdomen or in one of your legs. ? A severe headache.  You faint.  You bleed from your vagina so much that you fill more than one sanitary pad in one hour. Bleeding should not be heavier than your heaviest period. Summary  After the procedure, it is common to have pain at your incision site, abdominal cramping, and slight bleeding from your vagina.  Check  your incision area every day for signs of infection.  Tell your health care provider about any unusual symptoms.  Keep all follow-up visits for you and your baby as told by your health care provider. This information is not intended to replace advice given to you by your health care provider. Make sure you discuss any questions you have with your health care provider. Document Revised: 10/24/2017 Document Reviewed: 10/24/2017 [employer] Patient Education  2020 [employer]  

## 2020-04-28 ENCOUNTER — Ambulatory Visit: Payer: Medicaid Other | Admitting: Obstetrics and Gynecology

## 2020-05-12 ENCOUNTER — Ambulatory Visit (INDEPENDENT_AMBULATORY_CARE_PROVIDER_SITE_OTHER): Payer: Medicaid Other | Admitting: Obstetrics and Gynecology

## 2020-05-12 ENCOUNTER — Other Ambulatory Visit: Payer: Self-pay

## 2020-05-12 DIAGNOSIS — N946 Dysmenorrhea, unspecified: Secondary | ICD-10-CM | POA: Insufficient documentation

## 2020-05-12 DIAGNOSIS — R6882 Decreased libido: Secondary | ICD-10-CM | POA: Insufficient documentation

## 2020-05-12 NOTE — Progress Notes (Signed)
  CC: decreased libido after pregnancy Subjective:    Patient ID: Krista Carr, female    DOB: 24-Jun-1980, 40 y.o.   MRN: [id number]  HPI 40 yo G1P1 seen at [facility] office to discuss decreased libido.  Pt delivered by c section June 2021.  She has healed well and has no major issues.  Her thyroid medication has been lowered to 25 mg/day and is till being followed by an outside provider.  Pt notes decreased libido since she has delivered.  She notes adequate natural lubrication.  Pt notes orgasm with masturbation which is slightly uncomfortable.  She does admit to multiple life stresses as a new mother which may contribute to decreased interest in intercourse.   Review of Systems     Objective:   Physical Exam Vitals:   05/12/20 1008  BP: 129/80  Pulse: 73   C/s scar well healed and nontender, no erythema or sign of infection       Assessment & Plan:   1. Diminished libido Discussed possible treatment with prescription medication ie buproprion.  Pt declined Discussed need for maternal rest and relaxation as well as possible "newborn holiday" to aid in increasing intimacy with spouse.  Advised increased communication with spouse regarding intimacy.  Can refer to counselor if needed.  I spent 20 minutes dedicated to the care of this patient including previsit review of records, face to face time with the patient discussing post-pregnancy intimacy.   F/u PRN   Warden Fillers, MD Faculty Attending, [facility]

## 2020-05-12 NOTE — Progress Notes (Signed)
Pt states she is having some mild cramping with cycles.   Pt would like hormone check, states she is a little "blah" lately.  Pt is not on birth control at this time.
